# Patient Record
Sex: Female | Born: 1945 | Race: White | Hispanic: No | Marital: Married | Smoking: Former smoker
Health system: Southern US, Community
[De-identification: ages and names within clinical notes are randomized; demographics above are authoritative.]

## PROBLEM LIST (undated history)

## (undated) DIAGNOSIS — N1831 Chronic kidney disease, stage 3a: Secondary | ICD-10-CM

## (undated) DIAGNOSIS — M810 Age-related osteoporosis without current pathological fracture: Secondary | ICD-10-CM

## (undated) DIAGNOSIS — J40 Bronchitis, not specified as acute or chronic: Secondary | ICD-10-CM

## (undated) DIAGNOSIS — E785 Hyperlipidemia, unspecified: Secondary | ICD-10-CM

## (undated) DIAGNOSIS — R42 Dizziness and giddiness: Secondary | ICD-10-CM

## (undated) DIAGNOSIS — J449 Chronic obstructive pulmonary disease, unspecified: Secondary | ICD-10-CM

## (undated) DIAGNOSIS — M1990 Unspecified osteoarthritis, unspecified site: Secondary | ICD-10-CM

## (undated) DIAGNOSIS — I700 Atherosclerosis of aorta: Secondary | ICD-10-CM

## (undated) DIAGNOSIS — Z8659 Personal history of other mental and behavioral disorders: Secondary | ICD-10-CM

## (undated) DIAGNOSIS — E7800 Pure hypercholesterolemia, unspecified: Secondary | ICD-10-CM

## (undated) DIAGNOSIS — J302 Other seasonal allergic rhinitis: Secondary | ICD-10-CM

## (undated) DIAGNOSIS — J301 Allergic rhinitis due to pollen: Secondary | ICD-10-CM

## (undated) DIAGNOSIS — IMO0001 Reserved for inherently not codable concepts without codable children: Secondary | ICD-10-CM

## (undated) DIAGNOSIS — K760 Fatty (change of) liver, not elsewhere classified: Secondary | ICD-10-CM

## (undated) DIAGNOSIS — K219 Gastro-esophageal reflux disease without esophagitis: Secondary | ICD-10-CM

## (undated) DIAGNOSIS — J45909 Unspecified asthma, uncomplicated: Secondary | ICD-10-CM

## (undated) DIAGNOSIS — Z9889 Other specified postprocedural states: Secondary | ICD-10-CM

## (undated) DIAGNOSIS — K759 Inflammatory liver disease, unspecified: Secondary | ICD-10-CM

## (undated) DIAGNOSIS — J4540 Moderate persistent asthma, uncomplicated: Secondary | ICD-10-CM

## (undated) HISTORY — PX: DILATION AND CURETTAGE OF UTERUS: SHX78

## (undated) HISTORY — PX: OOPHORECTOMY: SHX86

## (undated) HISTORY — PX: ABDOMINAL HYSTERECTOMY: SHX81

## (undated) HISTORY — PX: EYE SURGERY: SHX253

---

## 2005-08-22 ENCOUNTER — Ambulatory Visit: Payer: Self-pay | Admitting: Family Medicine

## 2006-08-27 ENCOUNTER — Ambulatory Visit: Payer: Self-pay | Admitting: Family Medicine

## 2007-08-19 ENCOUNTER — Ambulatory Visit: Payer: Self-pay | Admitting: Gastroenterology

## 2007-11-04 ENCOUNTER — Ambulatory Visit: Payer: Self-pay | Admitting: Family Medicine

## 2008-11-08 ENCOUNTER — Ambulatory Visit: Payer: Self-pay | Admitting: Family Medicine

## 2009-11-14 ENCOUNTER — Ambulatory Visit: Payer: Self-pay | Admitting: Family Medicine

## 2010-11-23 ENCOUNTER — Ambulatory Visit: Payer: Self-pay | Admitting: Family Medicine

## 2011-05-02 ENCOUNTER — Ambulatory Visit: Payer: Self-pay | Admitting: Family Medicine

## 2011-12-20 ENCOUNTER — Ambulatory Visit: Payer: Self-pay | Admitting: Family Medicine

## 2012-12-23 ENCOUNTER — Ambulatory Visit: Payer: Self-pay | Admitting: Family Medicine

## 2013-10-07 ENCOUNTER — Ambulatory Visit: Payer: Self-pay | Admitting: Neurosurgery

## 2014-01-01 ENCOUNTER — Ambulatory Visit: Payer: Self-pay | Admitting: Family Medicine

## 2015-03-22 ENCOUNTER — Other Ambulatory Visit: Payer: Self-pay

## 2015-03-22 DIAGNOSIS — Z1231 Encounter for screening mammogram for malignant neoplasm of breast: Secondary | ICD-10-CM

## 2015-04-07 ENCOUNTER — Other Ambulatory Visit: Payer: Self-pay

## 2015-04-07 ENCOUNTER — Ambulatory Visit
Admission: RE | Admit: 2015-04-07 | Discharge: 2015-04-07 | Disposition: A | Discharge status: Home / Self Care | Payer: Medicare Other | Source: Ambulatory Visit | Attending: Family Medicine | Admitting: Family Medicine

## 2015-04-07 DIAGNOSIS — Z1231 Encounter for screening mammogram for malignant neoplasm of breast: Secondary | ICD-10-CM | POA: Diagnosis not present

## 2015-04-11 ENCOUNTER — Other Ambulatory Visit: Payer: Self-pay | Admitting: Family Medicine

## 2015-04-11 DIAGNOSIS — N63 Unspecified lump in unspecified breast: Secondary | ICD-10-CM

## 2015-04-11 DIAGNOSIS — R928 Other abnormal and inconclusive findings on diagnostic imaging of breast: Secondary | ICD-10-CM

## 2015-04-14 ENCOUNTER — Ambulatory Visit
Admission: RE | Admit: 2015-04-14 | Discharge: 2015-04-14 | Disposition: A | Discharge status: Home / Self Care | Payer: Medicare Other | Source: Ambulatory Visit | Attending: Family Medicine | Admitting: Family Medicine

## 2015-04-14 ENCOUNTER — Other Ambulatory Visit: Payer: Medicare Other

## 2015-04-14 DIAGNOSIS — R928 Other abnormal and inconclusive findings on diagnostic imaging of breast: Secondary | ICD-10-CM

## 2015-04-14 DIAGNOSIS — N63 Unspecified lump in unspecified breast: Secondary | ICD-10-CM

## 2015-04-14 DIAGNOSIS — R922 Inconclusive mammogram: Secondary | ICD-10-CM | POA: Insufficient documentation

## 2015-04-15 ENCOUNTER — Other Ambulatory Visit: Payer: Medicare Other

## 2015-04-15 ENCOUNTER — Ambulatory Visit: Payer: Medicare Other

## 2016-01-10 ENCOUNTER — Encounter: Payer: Self-pay | Admitting: *Deleted

## 2016-01-15 NOTE — H&P (Signed)
See scanned note.

## 2016-01-16 ENCOUNTER — Ambulatory Visit: Payer: Medicare HMO | Admitting: Certified Registered Nurse Anesthetist

## 2016-01-16 ENCOUNTER — Encounter
Admission: RE | Disposition: A | Discharge status: Home / Self Care | Payer: Self-pay | Source: Ambulatory Visit | Attending: Ophthalmology

## 2016-01-16 ENCOUNTER — Encounter: Payer: Self-pay | Admitting: *Deleted

## 2016-01-16 ENCOUNTER — Ambulatory Visit
Admission: RE | Admit: 2016-01-16 | Discharge: 2016-01-16 | Disposition: A | Discharge status: Home / Self Care | Payer: Medicare HMO | Source: Ambulatory Visit | Attending: Ophthalmology | Admitting: Ophthalmology

## 2016-01-16 DIAGNOSIS — Z881 Allergy status to other antibiotic agents status: Secondary | ICD-10-CM | POA: Insufficient documentation

## 2016-01-16 DIAGNOSIS — M1990 Unspecified osteoarthritis, unspecified site: Secondary | ICD-10-CM | POA: Diagnosis not present

## 2016-01-16 DIAGNOSIS — F17210 Nicotine dependence, cigarettes, uncomplicated: Secondary | ICD-10-CM | POA: Diagnosis not present

## 2016-01-16 DIAGNOSIS — Z7982 Long term (current) use of aspirin: Secondary | ICD-10-CM | POA: Insufficient documentation

## 2016-01-16 DIAGNOSIS — E7800 Pure hypercholesterolemia, unspecified: Secondary | ICD-10-CM | POA: Diagnosis not present

## 2016-01-16 DIAGNOSIS — J449 Chronic obstructive pulmonary disease, unspecified: Secondary | ICD-10-CM | POA: Diagnosis not present

## 2016-01-16 DIAGNOSIS — H2511 Age-related nuclear cataract, right eye: Secondary | ICD-10-CM | POA: Diagnosis present

## 2016-01-16 DIAGNOSIS — Z79899 Other long term (current) drug therapy: Secondary | ICD-10-CM | POA: Insufficient documentation

## 2016-01-16 HISTORY — DX: Unspecified osteoarthritis, unspecified site: M19.90

## 2016-01-16 HISTORY — PX: CATARACT EXTRACTION W/PHACO: SHX586

## 2016-01-16 HISTORY — DX: Reserved for inherently not codable concepts without codable children: IMO0001

## 2016-01-16 HISTORY — DX: Dizziness and giddiness: R42

## 2016-01-16 HISTORY — DX: Inflammatory liver disease, unspecified: K75.9

## 2016-01-16 SURGERY — PHACOEMULSIFICATION, CATARACT, WITH IOL INSERTION
Anesthesia: Monitor Anesthesia Care | Site: Eye | Laterality: Right | Wound class: Clean

## 2016-01-16 MED ORDER — NA CHONDROIT SULF-NA HYALURON 40-17 MG/ML IO SOLN
INTRAOCULAR | Status: DC | PRN
  Administered 2016-01-16: 1 mL via INTRAOCULAR

## 2016-01-16 MED ORDER — CYCLOPENTOLATE HCL 2 % OP SOLN
OPHTHALMIC | Status: AC
  Administered 2016-01-16: 1 [drp] via OPHTHALMIC
  Filled 2016-01-16: qty 2

## 2016-01-16 MED ORDER — SODIUM CHLORIDE 0.9 % IV SOLN
INTRAVENOUS | Status: DC
  Administered 2016-01-16: 07:00:00 via INTRAVENOUS

## 2016-01-16 MED ORDER — BUPIVACAINE HCL (PF) 0.75 % IJ SOLN
INTRAMUSCULAR | Status: AC
  Filled 2016-01-16: qty 10

## 2016-01-16 MED ORDER — FENTANYL CITRATE (PF) 100 MCG/2ML IJ SOLN: 25.0000 ug | INTRAMUSCULAR | Status: DC | PRN

## 2016-01-16 MED ORDER — BSS IO SOLN
INTRAOCULAR | Status: DC | PRN
  Administered 2016-01-16: 1 mL via OPHTHALMIC

## 2016-01-16 MED ORDER — MOXIFLOXACIN HCL 0.5 % OP SOLN
OPHTHALMIC | Status: DC | PRN
  Administered 2016-01-16: 1 [drp] via OPHTHALMIC

## 2016-01-16 MED ORDER — CEFUROXIME OPHTHALMIC INJECTION 1 MG/0.1 ML
INJECTION | OPHTHALMIC | Status: DC | PRN
  Administered 2016-01-16: .1 mL via INTRACAMERAL

## 2016-01-16 MED ORDER — CYCLOPENTOLATE HCL 2 % OP SOLN
1.0000 [drp] | OPHTHALMIC | Status: AC | PRN
  Administered 2016-01-16 (×4): 1 [drp] via OPHTHALMIC

## 2016-01-16 MED ORDER — PHENYLEPHRINE HCL 10 % OP SOLN
OPHTHALMIC | Status: AC
  Administered 2016-01-16: 1 [drp] via OPHTHALMIC
  Filled 2016-01-16: qty 5

## 2016-01-16 MED ORDER — CARBACHOL 0.01 % IO SOLN
INTRAOCULAR | Status: DC | PRN
  Administered 2016-01-16: .5 mL via INTRAOCULAR

## 2016-01-16 MED ORDER — EPINEPHRINE HCL 1 MG/ML IJ SOLN
INTRAMUSCULAR | Status: AC
  Filled 2016-01-16: qty 2

## 2016-01-16 MED ORDER — PHENYLEPHRINE HCL 10 % OP SOLN
1.0000 [drp] | OPHTHALMIC | Status: AC | PRN
  Administered 2016-01-16 (×4): 1 [drp] via OPHTHALMIC

## 2016-01-16 MED ORDER — NA CHONDROIT SULF-NA HYALURON 40-17 MG/ML IO SOLN
INTRAOCULAR | Status: AC
  Filled 2016-01-16: qty 1

## 2016-01-16 MED ORDER — MOXIFLOXACIN HCL 0.5 % OP SOLN
OPHTHALMIC | Status: AC
  Administered 2016-01-16: 1 [drp] via OPHTHALMIC
  Filled 2016-01-16: qty 3

## 2016-01-16 MED ORDER — TETRACAINE HCL 0.5 % OP SOLN
OPHTHALMIC | Status: DC | PRN
  Administered 2016-01-16: 1 [drp] via OPHTHALMIC

## 2016-01-16 MED ORDER — HYALURONIDASE HUMAN 150 UNIT/ML IJ SOLN
INTRAMUSCULAR | Status: AC
  Filled 2016-01-16: qty 1

## 2016-01-16 MED ORDER — MIDAZOLAM HCL 2 MG/2ML IJ SOLN
INTRAMUSCULAR | Status: DC | PRN
  Administered 2016-01-16 (×2): 1 mg via INTRAVENOUS

## 2016-01-16 MED ORDER — LIDOCAINE HCL (PF) 4 % IJ SOLN
INTRAMUSCULAR | Status: DC | PRN
  Administered 2016-01-16: 4 mL via OPHTHALMIC

## 2016-01-16 MED ORDER — MOXIFLOXACIN HCL 0.5 % OP SOLN
1.0000 [drp] | OPHTHALMIC | Status: AC | PRN
  Administered 2016-01-16 (×3): 1 [drp] via OPHTHALMIC

## 2016-01-16 MED ORDER — TETRACAINE HCL 0.5 % OP SOLN
OPHTHALMIC | Status: AC
  Filled 2016-01-16: qty 2

## 2016-01-16 MED ORDER — CEFUROXIME OPHTHALMIC INJECTION 1 MG/0.1 ML
INJECTION | OPHTHALMIC | Status: AC
  Filled 2016-01-16: qty 0.1

## 2016-01-16 MED ORDER — LIDOCAINE HCL (PF) 4 % IJ SOLN
INTRAOCULAR | Status: DC | PRN
  Administered 2016-01-16: .5 mL via OPHTHALMIC

## 2016-01-16 MED ORDER — ALFENTANIL 500 MCG/ML IJ INJ
INJECTION | INTRAMUSCULAR | Status: DC | PRN
  Administered 2016-01-16: 500 ug via INTRAVENOUS

## 2016-01-16 MED ORDER — ONDANSETRON HCL 4 MG/2ML IJ SOLN: 4.0000 mg | Freq: Once | INTRAMUSCULAR | Status: DC | PRN

## 2016-01-16 MED ORDER — LIDOCAINE HCL (PF) 4 % IJ SOLN
INTRAMUSCULAR | Status: AC
  Filled 2016-01-16: qty 5

## 2016-01-16 SURGICAL SUPPLY — 29 items
CANNULA ANT/CHMB 27GA (MISCELLANEOUS) ×2 IMPLANT
CORD BIP STRL DISP 12FT (MISCELLANEOUS) ×2 IMPLANT
CUP MEDICINE 2OZ PLAST GRAD ST (MISCELLANEOUS) ×2 IMPLANT
DRAPE XRAY CASSETTE 23X24 (DRAPES) ×2 IMPLANT
ERASER HMR WETFIELD 18G (MISCELLANEOUS) ×2 IMPLANT
GLOVE BIO SURGEON STRL SZ8 (GLOVE) ×2 IMPLANT
GLOVE SURG LX 6.5 MICRO (GLOVE) ×1
GLOVE SURG LX 8.0 MICRO (GLOVE) ×1
GLOVE SURG LX STRL 6.5 MICRO (GLOVE) ×1 IMPLANT
GLOVE SURG LX STRL 8.0 MICRO (GLOVE) ×1 IMPLANT
GOWN STRL REUS W/ TWL LRG LVL3 (GOWN DISPOSABLE) ×1 IMPLANT
GOWN STRL REUS W/ TWL XL LVL3 (GOWN DISPOSABLE) ×1 IMPLANT
GOWN STRL REUS W/TWL LRG LVL3 (GOWN DISPOSABLE) ×1
GOWN STRL REUS W/TWL XL LVL3 (GOWN DISPOSABLE) ×1
LENS IOL ACRYSOF IQ 22.0 (Intraocular Lens) ×2 IMPLANT
PACK CATARACT (MISCELLANEOUS) ×2 IMPLANT
PACK CATARACT DINGLEDEIN LX (MISCELLANEOUS) ×2 IMPLANT
PACK EYE AFTER SURG (MISCELLANEOUS) ×2 IMPLANT
SHLD EYE VISITEC  UNIV (MISCELLANEOUS) ×2 IMPLANT
SOL BSS BAG (MISCELLANEOUS) ×2
SOL PREP PVP 2OZ (MISCELLANEOUS) ×2
SOLUTION BSS BAG (MISCELLANEOUS) ×1 IMPLANT
SOLUTION PREP PVP 2OZ (MISCELLANEOUS) ×1 IMPLANT
SUT SILK 5-0 (SUTURE) ×2 IMPLANT
SYR 3ML LL SCALE MARK (SYRINGE) ×2 IMPLANT
SYR 5ML LL (SYRINGE) ×2 IMPLANT
SYR TB 1ML 27GX1/2 LL (SYRINGE) ×2 IMPLANT
WATER STERILE IRR 1000ML POUR (IV SOLUTION) ×2 IMPLANT
WIPE NON LINTING 3.25X3.25 (MISCELLANEOUS) ×2 IMPLANT

## 2016-01-16 NOTE — Transfer of Care (Signed)
Immediate Anesthesia Transfer of Care Note  Patient: Caydance Writer Wolin  Procedure(s) Performed: Procedure(s) with comments: CATARACT EXTRACTION PHACO AND INTRAOCULAR LENS PLACEMENT (IOC) (Right) - [place] 01:14 AP% 25.7 CDE 35.31 fluid pack lot # [id number]  Patient Location: PACU  Anesthesia Type:MAC  Level of Consciousness: awake, alert , oriented and patient cooperative  Airway & Oxygen Therapy: Patient Spontanous Breathing  Post-op Assessment: Report given to RN and Post -op Vital signs reviewed and stable  Post vital signs: Reviewed and stable  Last Vitals:  Filed Vitals:   01/16/16 0700 01/16/16 0906  BP: 149/59 121/53  Pulse: 52 54  Temp: 36.6 C 36.6 C  Resp: 20 20    Complications: No apparent anesthesia complications

## 2016-01-16 NOTE — Interval H&P Note (Signed)
History and Physical Interval Note:  01/16/2016 7:27 AM  Whitney Glover  has presented today for surgery, with the diagnosis of CATARACT  The various methods of treatment have been discussed with the patient and family. After consideration of risks, benefits and other options for treatment, the patient has consented to  Procedure(s): CATARACT EXTRACTION PHACO AND INTRAOCULAR LENS PLACEMENT ([place]) (Right) as a surgical intervention .  The patient's history has been reviewed, patient examined, no change in status, stable for surgery.  I have reviewed the patient's chart and labs.  Questions were answered to the patient's satisfaction.     Faaris Arizpe

## 2016-01-16 NOTE — Anesthesia Preprocedure Evaluation (Signed)
Anesthesia Evaluation  Patient identified by MRN, date of birth, ID band Patient awake    Reviewed: Allergy & Precautions, NPO status , Patient's Chart, lab work & pertinent test results  Airway Mallampati: II  TM Distance: <3 FB     Dental  (+) Upper Dentures, Partial Lower   Pulmonary shortness of breath and with exertion, COPD,  COPD inhaler, Current Smoker,    Pulmonary exam normal breath sounds clear to auscultation       Cardiovascular negative cardio ROS Normal cardiovascular exam     Neuro/Psych negative neurological ROS  negative psych ROS   GI/Hepatic negative GI ROS, (+) Hepatitis -, Unspecified  Endo/Other  negative endocrine ROS  Renal/GU negative Renal ROS  negative genitourinary   Musculoskeletal  (+) Arthritis , Osteoarthritis,    Abdominal Normal abdominal exam  (+)   Peds negative pediatric ROS (+)  Hematology negative hematology ROS (+)   Anesthesia Other Findings   Reproductive/Obstetrics                             Anesthesia Physical Anesthesia Plan  ASA: III  Anesthesia Plan: MAC and General   Post-op Pain Management:    Induction: Intravenous  Airway Management Planned: Nasal Cannula  Additional Equipment:   Intra-op Plan:   Post-operative Plan:   Informed Consent: I have reviewed the patients History and Physical, chart, labs and discussed the procedure including the risks, benefits and alternatives for the proposed anesthesia with the patient or authorized representative who has indicated his/her understanding and acceptance.   Dental advisory given  Plan Discussed with: CRNA and Surgeon  Anesthesia Plan Comments:         Anesthesia Quick Evaluation

## 2016-01-16 NOTE — Discharge Instructions (Signed)
AMBULATORY SURGERY  DISCHARGE INSTRUCTIONS   1) The drugs that you were given will stay in your system until tomorrow so for the next 24 hours you should not:  A) Drive an automobile B) Make any legal decisions C) Drink any alcoholic beverage   2) You may resume regular meals tomorrow.  Today it is better to start with liquids and gradually work up to solid foods.  You may eat anything you prefer, but it is better to start with liquids, then soup and crackers, and gradually work up to solid foods.   3) Please notify your doctor immediately if you have any unusual bleeding, trouble breathing, redness and pain at the surgery site, drainage, fever, or pain not relieved by medication.    4) Additional Instructions:   Eye Surgery Discharge Instructions  Expect mild scratchy sensation or mild soreness. DO NOT RUB YOUR EYE!  The day of surgery:  Minimal physical activity, but bed rest is not required  No reading, computer work, or close hand work  No bending, lifting, or straining.  May watch TV  For 24 hours:  No driving, legal decisions, or alcoholic beverages  Safety precautions  Eat anything you prefer: It is better to start with liquids, then soup then solid foods.  _____ Eye patch should be worn until postoperative exam tomorrow.  ____ Solar shield eyeglasses should be worn for comfort in the sunlight/patch while sleeping  Resume all regular medications including aspirin or Coumadin if these were discontinued prior to surgery. You may shower, bathe, shave, or wash your hair. Tylenol may be taken for mild discomfort.  Call your doctor if you experience significant pain, nausea, or vomiting, fever > 101 or other signs of infection. [phone] or [phone] Specific instructions:  Follow-up Information    Follow up with Mashonda Broski, MD In 1 day.   Specialty:  Ophthalmology   Why:  February 28 at 8:10am   Contact information:   [address]   [place] [address] [phone]          Please contact your physician with any problems or Same Day Surgery at [phone], Monday through Friday 6 am to 4 pm, or [place] at [facility] number at [phone].

## 2016-01-16 NOTE — Op Note (Signed)
Date of Surgery: 01/16/2016 Date of Dictation: 01/16/2016 9:00 AM Pre-operative Diagnosis:  Nuclear Sclerotic Cataract right Eye Post-operative Diagnosis: same Procedure performed: Extra-capsular Cataract Extraction (ECCE) with placement of a posterior chamber intraocular lens (IOL) right Eye IOL:  Implant Name Type Inv. Item Serial No. Manufacturer Lot No. LRB No. Used  LENS IOL ACRYSOF IQ 22.0 - [id number] Intraocular Lens LENS IOL ACRYSOF IQ 22.0 [id number] ALCON [id number] Right 1   Anesthesia: 2% Lidocaine and 4% Marcaine in a 50/50 mixture with 10 unites/ml of Hylenex given as a peribulbar Anesthesiologist: Anesthesiologist: Martha Clan, MD CRNA: Bernardo Heater, CRNA Complications: none Estimated Blood Loss: less than 1 ml  Description of procedure:  The patient was given anesthesia and sedation via intravenous access. The patient was then prepped and draped in the usual fashion. A 25-gauge needle was bent for initiating the capsulorhexis. A 5-0 silk suture was placed through the conjunctiva superior and inferiorly to serve as bridle sutures. Hemostasis was obtained at the superior limbus using an eraser cautery. A partial thickness groove was made at the anterior surgical limbus with a 64 Beaver blade and this was dissected anteriorly with an Avaya. The anterior chamber was entered at 10 o'clock with a 1.0 mm paracentesis knife and through the lamellar dissection with a 2.6 mm Alcon keratome. Epi-Shugarcaine 0.5 CC [9 cc BSS Plus (Alcon), 3 cc 4% preservative-free lidocaine (Hospira) and 4 cc 1:1000 preservative-free, bisulfite-free epinephrine] was injected into the anterior chamber via the paracentesis tract. Epi-Shugarcaine 0.5 CC [9 cc BSS Plus (Alcon), 3 cc 4% preservative-free lidocaine (Hospira) and 4 cc 1:1000 preservative-free, bisulfite-free epinephrine] was injected into the anterior chamber via the paracentesis tract. DiscoVisc was injected to replace the aqueous  and a continuous tear curvilinear capsulorhexis was performed using a bent 25-gauge needle.  Balance salt on a syringe was used to perform hydro-dissection and phacoemulsification was carried out using a divide and conquer technique. Procedure(s) with comments: CATARACT EXTRACTION PHACO AND INTRAOCULAR LENS PLACEMENT ([facility]) (Right) - [place] 01:14 AP% 25.7 CDE 35.31 fluid pack lot # [id number]. Irrigation/aspiration was used to remove the residual cortex and the capsular bag was inflated with DiscoVisc. The intraocular lens was inserted into the capsular bag using a pre-loaded UltraSert Delivery System. Irrigation/aspiration was used to remove the residual DiscoVisc. The wound was inflated with balanced salt and checked for leaks. None were found. Miostat was injected via the paracentesis track and 0.1 ml of cefuroxime containing 1 mg of drug  was injected via the paracentesis track. The wound was checked for leaks again and none were found.   The bridal sutures were removed and two drops of Vigamox were placed on the eye. An eye shield was placed to protect the eye and the patient was discharged to the recovery area in good condition.   Brevin Mcfadden MD

## 2016-01-17 NOTE — Anesthesia Postprocedure Evaluation (Signed)
Anesthesia Post Note  Patient: Whitney Glover  Procedure(s) Performed: Procedure(s) (LRB): CATARACT EXTRACTION PHACO AND INTRAOCULAR LENS PLACEMENT (IOC) (Right)  Patient location during evaluation: PACU Anesthesia Type: General Level of consciousness: awake and alert Pain management: pain level controlled Vital Signs Assessment: post-procedure vital signs reviewed and stable Respiratory status: spontaneous breathing, nonlabored ventilation, respiratory function stable and patient connected to nasal cannula oxygen Cardiovascular status: blood pressure returned to baseline and stable Postop Assessment: no signs of nausea or vomiting Anesthetic complications: no    Last Vitals:  Filed Vitals:   01/16/16 0700 01/16/16 0906  BP: 149/59 121/53  Pulse: 52 54  Temp: 36.6 C 36.6 C  Resp: 20 20    Last Pain:  Filed Vitals:   01/17/16 0806  PainSc: 0-No pain                 Martha Clan

## 2016-02-03 ENCOUNTER — Encounter: Payer: Self-pay | Admitting: *Deleted

## 2016-02-05 NOTE — H&P (Signed)
See scanned note.

## 2016-02-06 ENCOUNTER — Ambulatory Visit: Payer: Medicare HMO | Admitting: Anesthesiology

## 2016-02-06 ENCOUNTER — Encounter: Payer: Self-pay | Admitting: *Deleted

## 2016-02-06 ENCOUNTER — Encounter
Admission: RE | Disposition: A | Discharge status: Home / Self Care | Payer: Self-pay | Source: Ambulatory Visit | Attending: Ophthalmology

## 2016-02-06 ENCOUNTER — Ambulatory Visit
Admission: RE | Admit: 2016-02-06 | Discharge: 2016-02-06 | Disposition: A | Discharge status: Home / Self Care | Payer: Medicare HMO | Source: Ambulatory Visit | Attending: Ophthalmology | Admitting: Ophthalmology

## 2016-02-06 DIAGNOSIS — F17210 Nicotine dependence, cigarettes, uncomplicated: Secondary | ICD-10-CM | POA: Diagnosis not present

## 2016-02-06 DIAGNOSIS — H2512 Age-related nuclear cataract, left eye: Secondary | ICD-10-CM | POA: Diagnosis present

## 2016-02-06 DIAGNOSIS — J449 Chronic obstructive pulmonary disease, unspecified: Secondary | ICD-10-CM | POA: Diagnosis not present

## 2016-02-06 DIAGNOSIS — Z7982 Long term (current) use of aspirin: Secondary | ICD-10-CM | POA: Diagnosis not present

## 2016-02-06 DIAGNOSIS — Z79899 Other long term (current) drug therapy: Secondary | ICD-10-CM | POA: Diagnosis not present

## 2016-02-06 DIAGNOSIS — M1990 Unspecified osteoarthritis, unspecified site: Secondary | ICD-10-CM | POA: Diagnosis not present

## 2016-02-06 DIAGNOSIS — E7800 Pure hypercholesterolemia, unspecified: Secondary | ICD-10-CM | POA: Insufficient documentation

## 2016-02-06 HISTORY — PX: CATARACT EXTRACTION W/PHACO: SHX586

## 2016-02-06 HISTORY — DX: Bronchitis, not specified as acute or chronic: J40

## 2016-02-06 HISTORY — DX: Pure hypercholesterolemia, unspecified: E78.00

## 2016-02-06 HISTORY — DX: Other seasonal allergic rhinitis: J30.2

## 2016-02-06 SURGERY — PHACOEMULSIFICATION, CATARACT, WITH IOL INSERTION
Anesthesia: Monitor Anesthesia Care | Site: Eye | Laterality: Left | Wound class: Clean

## 2016-02-06 MED ORDER — CEFUROXIME OPHTHALMIC INJECTION 1 MG/0.1 ML
INJECTION | OPHTHALMIC | Status: AC
  Filled 2016-02-06: qty 0.1

## 2016-02-06 MED ORDER — PHENYLEPHRINE HCL 10 % OP SOLN
1.0000 [drp] | Freq: Once | OPHTHALMIC | Status: AC
  Administered 2016-02-06: 1 [drp] via OPHTHALMIC

## 2016-02-06 MED ORDER — LIDOCAINE HCL (PF) 4 % IJ SOLN
INTRAOCULAR | Status: DC | PRN
  Administered 2016-02-06: .5 mL via OPHTHALMIC

## 2016-02-06 MED ORDER — PHENYLEPHRINE HCL 10 % OP SOLN
OPHTHALMIC | Status: AC
  Administered 2016-02-06: 1 [drp] via OPHTHALMIC
  Filled 2016-02-06: qty 5

## 2016-02-06 MED ORDER — LIDOCAINE HCL (PF) 4 % IJ SOLN
INTRAMUSCULAR | Status: DC | PRN
  Administered 2016-02-06: 4 mL via OPHTHALMIC

## 2016-02-06 MED ORDER — NA CHONDROIT SULF-NA HYALURON 40-17 MG/ML IO SOLN
INTRAOCULAR | Status: DC | PRN
  Administered 2016-02-06: 1 mL via INTRAOCULAR

## 2016-02-06 MED ORDER — BUPIVACAINE HCL (PF) 0.75 % IJ SOLN
INTRAMUSCULAR | Status: AC
  Filled 2016-02-06: qty 10

## 2016-02-06 MED ORDER — CEFUROXIME OPHTHALMIC INJECTION 1 MG/0.1 ML
INJECTION | OPHTHALMIC | Status: DC | PRN
  Administered 2016-02-06: .1 mL via INTRACAMERAL

## 2016-02-06 MED ORDER — CYCLOPENTOLATE HCL 2 % OP SOLN
1.0000 [drp] | OPHTHALMIC | Status: AC
  Administered 2016-02-06 (×3): 1 [drp] via OPHTHALMIC

## 2016-02-06 MED ORDER — CYCLOPENTOLATE HCL 2 % OP SOLN
1.0000 [drp] | Freq: Once | OPHTHALMIC | Status: AC
  Administered 2016-02-06: 1 [drp] via OPHTHALMIC

## 2016-02-06 MED ORDER — PHENYLEPHRINE HCL 10 % OP SOLN
1.0000 [drp] | OPHTHALMIC | Status: AC
  Administered 2016-02-06 (×3): 1 [drp] via OPHTHALMIC

## 2016-02-06 MED ORDER — MOXIFLOXACIN HCL 0.5 % OP SOLN
1.0000 [drp] | OPHTHALMIC | Status: AC
  Administered 2016-02-06 (×2): 1 [drp] via OPHTHALMIC

## 2016-02-06 MED ORDER — TETRACAINE HCL 0.5 % OP SOLN
OPHTHALMIC | Status: AC
  Filled 2016-02-06: qty 2

## 2016-02-06 MED ORDER — MOXIFLOXACIN HCL 0.5 % OP SOLN
OPHTHALMIC | Status: AC
  Administered 2016-02-06: 1 [drp] via OPHTHALMIC
  Filled 2016-02-06: qty 3

## 2016-02-06 MED ORDER — SODIUM CHLORIDE 0.9 % IV SOLN
INTRAVENOUS | Status: DC
  Administered 2016-02-06: 08:00:00 via INTRAVENOUS

## 2016-02-06 MED ORDER — EPINEPHRINE HCL 1 MG/ML IJ SOLN
INTRAOCULAR | Status: DC | PRN
  Administered 2016-02-06: 1 mL via OPHTHALMIC

## 2016-02-06 MED ORDER — EPINEPHRINE HCL 1 MG/ML IJ SOLN
INTRAMUSCULAR | Status: AC
  Filled 2016-02-06: qty 2

## 2016-02-06 MED ORDER — CYCLOPENTOLATE HCL 2 % OP SOLN
OPHTHALMIC | Status: AC
  Administered 2016-02-06: 1 [drp] via OPHTHALMIC
  Filled 2016-02-06: qty 2

## 2016-02-06 MED ORDER — TETRACAINE HCL 0.5 % OP SOLN
OPHTHALMIC | Status: DC | PRN
  Administered 2016-02-06: 1 [drp] via OPHTHALMIC

## 2016-02-06 MED ORDER — MOXIFLOXACIN HCL 0.5 % OP SOLN
1.0000 [drp] | Freq: Once | OPHTHALMIC | Status: AC
  Administered 2016-02-06: 1 [drp] via OPHTHALMIC

## 2016-02-06 MED ORDER — MOXIFLOXACIN HCL 0.5 % OP SOLN
OPHTHALMIC | Status: DC | PRN
  Administered 2016-02-06: 1 [drp] via OPHTHALMIC

## 2016-02-06 MED ORDER — POVIDONE-IODINE 5 % OP SOLN
OPHTHALMIC | Status: DC | PRN
  Administered 2016-02-06: 1 via OPHTHALMIC

## 2016-02-06 MED ORDER — POVIDONE-IODINE 5 % OP SOLN
OPHTHALMIC | Status: AC
  Filled 2016-02-06: qty 30

## 2016-02-06 MED ORDER — CARBACHOL 0.01 % IO SOLN
INTRAOCULAR | Status: DC | PRN
  Administered 2016-02-06: .5 mL via INTRAOCULAR

## 2016-02-06 MED ORDER — NA CHONDROIT SULF-NA HYALURON 40-17 MG/ML IO SOLN
INTRAOCULAR | Status: AC
  Filled 2016-02-06: qty 1

## 2016-02-06 MED ORDER — LIDOCAINE HCL (PF) 4 % IJ SOLN
INTRAMUSCULAR | Status: AC
  Filled 2016-02-06: qty 5

## 2016-02-06 MED ORDER — HYALURONIDASE HUMAN 150 UNIT/ML IJ SOLN
INTRAMUSCULAR | Status: AC
  Filled 2016-02-06: qty 1

## 2016-02-06 MED ORDER — ALFENTANIL 500 MCG/ML IJ INJ
INJECTION | INTRAMUSCULAR | Status: DC | PRN
  Administered 2016-02-06: 500 ug via INTRAVENOUS

## 2016-02-06 MED ORDER — ONDANSETRON HCL 4 MG/2ML IJ SOLN
INTRAMUSCULAR | Status: DC | PRN
  Administered 2016-02-06: 4 mg via INTRAVENOUS

## 2016-02-06 MED ORDER — MIDAZOLAM HCL 2 MG/2ML IJ SOLN
INTRAMUSCULAR | Status: DC | PRN
  Administered 2016-02-06 (×2): 0.5 mg via INTRAVENOUS

## 2016-02-06 SURGICAL SUPPLY — 29 items
CANNULA ANT/CHMB 27GA (MISCELLANEOUS) ×2 IMPLANT
CORD BIP STRL DISP 12FT (MISCELLANEOUS) ×2 IMPLANT
CUP MEDICINE 2OZ PLAST GRAD ST (MISCELLANEOUS) ×2 IMPLANT
DRAPE XRAY CASSETTE 23X24 (DRAPES) ×2 IMPLANT
ERASER HMR WETFIELD 18G (MISCELLANEOUS) ×2 IMPLANT
GLOVE BIO SURGEON STRL SZ8 (GLOVE) ×2 IMPLANT
GLOVE SURG LX 6.5 MICRO (GLOVE) ×1
GLOVE SURG LX 8.0 MICRO (GLOVE) ×1
GLOVE SURG LX STRL 6.5 MICRO (GLOVE) ×1 IMPLANT
GLOVE SURG LX STRL 8.0 MICRO (GLOVE) ×1 IMPLANT
GOWN STRL REUS W/ TWL LRG LVL3 (GOWN DISPOSABLE) ×1 IMPLANT
GOWN STRL REUS W/ TWL XL LVL3 (GOWN DISPOSABLE) ×1 IMPLANT
GOWN STRL REUS W/TWL LRG LVL3 (GOWN DISPOSABLE) ×1
GOWN STRL REUS W/TWL XL LVL3 (GOWN DISPOSABLE) ×1
LENS IOL ACRYSOF IQ 21.5 (Intraocular Lens) ×2 IMPLANT
PACK CATARACT (MISCELLANEOUS) ×2 IMPLANT
PACK CATARACT DINGLEDEIN LX (MISCELLANEOUS) ×2 IMPLANT
PACK EYE AFTER SURG (MISCELLANEOUS) ×2 IMPLANT
SHLD EYE VISITEC  UNIV (MISCELLANEOUS) ×2 IMPLANT
SOL BSS BAG (MISCELLANEOUS) ×2
SOL PREP PVP 2OZ (MISCELLANEOUS) ×2
SOLUTION BSS BAG (MISCELLANEOUS) ×1 IMPLANT
SOLUTION PREP PVP 2OZ (MISCELLANEOUS) ×1 IMPLANT
SUT SILK 5-0 (SUTURE) ×2 IMPLANT
SYR 3ML LL SCALE MARK (SYRINGE) ×2 IMPLANT
SYR 5ML LL (SYRINGE) ×2 IMPLANT
SYR TB 1ML 27GX1/2 LL (SYRINGE) ×2 IMPLANT
WATER STERILE IRR 1000ML POUR (IV SOLUTION) ×2 IMPLANT
WIPE NON LINTING 3.25X3.25 (MISCELLANEOUS) ×2 IMPLANT

## 2016-02-06 NOTE — Discharge Instructions (Signed)
Eye Surgery Discharge Instructions  Expect mild scratchy sensation or mild soreness. DO NOT RUB YOUR EYE!  The day of surgery:  Minimal physical activity, but bed rest is not required  No reading, computer work, or close hand work  No bending, lifting, or straining.  May watch TV  For 24 hours:  No driving, legal decisions, or alcoholic beverages  Safety precautions  Eat anything you prefer: It is better to start with liquids, then soup then solid foods.  _____ Eye patch should be worn until postoperative exam tomorrow.  ____ Solar shield eyeglasses should be worn for comfort in the sunlight/patch while sleeping  Resume all regular medications including aspirin or Coumadin if these were discontinued prior to surgery. You may shower, bathe, shave, or wash your hair. Tylenol may be taken for mild discomfort.  Call your doctor if you experience significant pain, nausea, or vomiting, fever > 101 or other signs of infection. [phone] or [phone] Specific instructions:  Follow-up Information    Follow up with Estill Cotta, MD.   Specialty:  Ophthalmology   Why:  follow up 3/21 at [place] information:   [place] [address] [phone]     AMBULATORY SURGERY  DISCHARGE INSTRUCTIONS   1) The drugs that you were given will stay in your system until tomorrow so for the next 24 hours you should not:  A) Drive an automobile B) Make any legal decisions C) Drink any alcoholic beverage   2) You may resume regular meals tomorrow.  Today it is better to start with liquids and gradually work up to solid foods.  You may eat anything you prefer, but it is better to start with liquids, then soup and crackers, and gradually work up to solid foods.   3) Please notify your doctor immediately if you have any unusual bleeding, trouble breathing, redness and pain at the surgery site, drainage, fever, or pain not relieved by  medication.    4) Additional Instructions:        Please contact your physician with any problems or Same Day Surgery at [phone], Monday through Friday 6 am to 4 pm, or [place] at [facility] number at [phone].

## 2016-02-06 NOTE — Op Note (Signed)
Date of Surgery: 02/06/2016 Date of Dictation: 02/06/2016 9:50 AM Pre-operative Diagnosis:  Nuclear Sclerotic Cataract left Eye Post-operative Diagnosis: same Procedure performed: Extra-capsular Cataract Extraction (ECCE) with placement of a posterior chamber intraocular lens (IOL) left Eye IOL:  Implant Name Type Inv. Item Serial No. Manufacturer Lot No. LRB No. Used  LENS IOL ACRYSOF IQ 21.5 - [id number] Intraocular Lens LENS IOL ACRYSOF IQ 21.5 [id number] ALCON [id number] Left 1   Anesthesia: 2% Lidocaine and 4% Marcaine in a 50/50 mixture with 10 unites/ml of Hylenex given as a peribulbar Anesthesiologist: Anesthesiologist: Lyn Hollingshead, MD CRNA: Courtney Paris, CRNA Complications: none Estimated Blood Loss: less than 1 ml  Description of procedure:  The patient was given anesthesia and sedation via intravenous access. The patient was then prepped and draped in the usual fashion. A 25-gauge needle was bent for initiating the capsulorhexis. A 5-0 silk suture was placed through the conjunctiva superior and inferiorly to serve as bridle sutures. Hemostasis was obtained at the superior limbus using an eraser cautery. A partial thickness groove was made at the anterior surgical limbus with a 64 Beaver blade and this was dissected anteriorly with an Avaya. The anterior chamber was entered at 10 o'clock with a 1.0 mm paracentesis knife and through the lamellar dissection with a 2.6 mm Alcon keratome. Epi-Shugarcaine 0.5 CC [9 cc BSS Plus (Alcon), 3 cc 4% preservative-free lidocaine (Hospira) and 4 cc 1:1000 preservative-free, bisulfite-free epinephrine] was injected into the anterior chamber via the paracentesis tract. Epi-Shugarcaine 0.5 CC [9 cc BSS Plus (Alcon), 3 cc 4% preservative-free lidocaine (Hospira) and 4 cc 1:1000 preservative-free, bisulfite-free epinephrine] was injected into the anterior chamber via the paracentesis tract. DiscoVisc was injected to replace the  aqueous and a continuous tear curvilinear capsulorhexis was performed using a bent 25-gauge needle.  Balance salt on a syringe was used to perform hydro-dissection and phacoemulsification was carried out using a divide and conquer technique. Procedure(s) with comments: CATARACT EXTRACTION PHACO AND INTRAOCULAR LENS PLACEMENT (IOC) (Left) - [place] 01:13 AP% 27.3 CDE 33.25 fluid opack lot # [id number]. Irrigation/aspiration was used to remove the residual cortex and the capsular bag was inflated with DiscoVisc. The intraocular lens was inserted into the capsular bag using a pre-loaded UltraSert Delivery System. Irrigation/aspiration was used to remove the residual DiscoVisc. The wound was inflated with balanced salt and checked for leaks. None were found. Miostat was injected via the paracentesis track and 0.1 ml of cefuroxime containing 1 mg of drug  was injected via the paracentesis track. The wound was checked for leaks again and none were found.   The bridal sutures were removed and two drops of Vigamox were placed on the eye. An eye shield was placed to protect the eye and the patient was discharged to the recovery area in good condition.   Quintessa Simmerman MD

## 2016-02-06 NOTE — Interval H&P Note (Signed)
History and Physical Interval Note:  02/06/2016 9:09 AM  Whitney Glover  has presented today for surgery, with the diagnosis of CATARACT  The various methods of treatment have been discussed with the patient and family. After consideration of risks, benefits and other options for treatment, the patient has consented to  Procedure(s): CATARACT EXTRACTION PHACO AND INTRAOCULAR LENS PLACEMENT ([place]) (Left) as a surgical intervention .  The patient's history has been reviewed, patient examined, no change in status, stable for surgery.  I have reviewed the patient's chart and labs.  Questions were answered to the patient's satisfaction.     [name]

## 2016-02-06 NOTE — Anesthesia Preprocedure Evaluation (Signed)
Anesthesia Evaluation  Patient identified by MRN, date of birth, ID band Patient awake    Reviewed: Allergy & Precautions, NPO status , Patient's Chart, lab work & pertinent test results  Airway Mallampati: II  TM Distance: <3 FB     Dental  (+) Upper Dentures, Partial Lower   Pulmonary shortness of breath and with exertion, COPD,  COPD inhaler, Current Smoker,    Pulmonary exam normal        Cardiovascular negative cardio ROS Normal cardiovascular exam     Neuro/Psych negative neurological ROS  negative psych ROS   GI/Hepatic negative GI ROS, (+) Hepatitis -, Unspecified  Endo/Other  negative endocrine ROS  Renal/GU negative Renal ROS  negative genitourinary   Musculoskeletal  (+) Arthritis , Osteoarthritis,    Abdominal Normal abdominal exam  (+)   Peds negative pediatric ROS (+)  Hematology negative hematology ROS (+)   Anesthesia Other Findings   Reproductive/Obstetrics                             Anesthesia Physical  Anesthesia Plan  ASA: III  Anesthesia Plan: MAC   Post-op Pain Management:    Induction: Intravenous  Airway Management Planned: Nasal Cannula  Additional Equipment:   Intra-op Plan:   Post-operative Plan:   Informed Consent: I have reviewed the patients History and Physical, chart, labs and discussed the procedure including the risks, benefits and alternatives for the proposed anesthesia with the patient or authorized representative who has indicated his/her understanding and acceptance.     Plan Discussed with: CRNA and Surgeon  Anesthesia Plan Comments:         Anesthesia Quick Evaluation

## 2016-02-06 NOTE — Anesthesia Postprocedure Evaluation (Signed)
Anesthesia Post Note  Patient: Whitney Glover  Procedure(s) Performed: Procedure(s) (LRB): CATARACT EXTRACTION PHACO AND INTRAOCULAR LENS PLACEMENT (IOC) (Left)  Patient location during evaluation: PACU Anesthesia Type: MAC Level of consciousness: awake Pain management: pain level controlled Vital Signs Assessment: post-procedure vital signs reviewed and stable Respiratory status: spontaneous breathing Cardiovascular status: stable Postop Assessment: no signs of nausea or vomiting and adequate PO intake Anesthetic complications: no    Last Vitals:  Filed Vitals:   02/06/16 0952 02/06/16 1007  BP: 119/49 129/55  Pulse: 49 55  Temp: 36.4 C   Resp: 18 18    Last Pain: There were no vitals filed for this visit.               [place]

## 2016-02-06 NOTE — Transfer of Care (Signed)
Immediate Anesthesia Transfer of Care Note  Patient: Whitney Glover  Procedure(s) Performed: Procedure(s) with comments: CATARACT EXTRACTION PHACO AND INTRAOCULAR LENS PLACEMENT (IOC) (Left) - [place] 01:13 AP% 27.3 CDE 33.25 fluid opack lot # [id number]  Patient Location: PACU and Short Stay  Anesthesia Type:MAC  Level of Consciousness: awake, oriented and patient cooperative  Airway & Oxygen Therapy: Patient Spontanous Breathing  Post-op Assessment: Report given to RN and Post -op Vital signs reviewed and stable  Post vital signs: Reviewed and stable  Last Vitals:  Filed Vitals:   02/06/16 0742  BP: 136/111  Pulse: 59  Temp: 36.6 C  Resp: 20    Complications: No apparent anesthesia complications

## 2016-04-18 ENCOUNTER — Other Ambulatory Visit: Payer: Self-pay | Admitting: *Deleted

## 2016-04-20 ENCOUNTER — Other Ambulatory Visit: Payer: Self-pay | Admitting: Family Medicine

## 2016-04-20 DIAGNOSIS — Z1231 Encounter for screening mammogram for malignant neoplasm of breast: Secondary | ICD-10-CM

## 2016-05-02 ENCOUNTER — Other Ambulatory Visit: Payer: Self-pay | Admitting: Family Medicine

## 2016-05-02 ENCOUNTER — Ambulatory Visit
Admission: RE | Admit: 2016-05-02 | Discharge: 2016-05-02 | Disposition: A | Discharge status: Home / Self Care | Payer: Medicare HMO | Source: Ambulatory Visit | Attending: Family Medicine | Admitting: Family Medicine

## 2016-05-02 DIAGNOSIS — Z1231 Encounter for screening mammogram for malignant neoplasm of breast: Secondary | ICD-10-CM

## 2017-04-15 ENCOUNTER — Emergency Department
Admission: EM | Admit: 2017-04-15 | Discharge: 2017-04-15 | Disposition: A | Discharge status: Home / Self Care | Payer: Medicare HMO | Attending: Student in an Organized Health Care Education/Training Program | Admitting: Student in an Organized Health Care Education/Training Program

## 2017-04-15 ENCOUNTER — Encounter: Payer: Self-pay | Admitting: Emergency Medicine

## 2017-04-15 ENCOUNTER — Emergency Department: Payer: Medicare HMO

## 2017-04-15 DIAGNOSIS — S299XXA Unspecified injury of thorax, initial encounter: Secondary | ICD-10-CM | POA: Diagnosis present

## 2017-04-15 DIAGNOSIS — Z7982 Long term (current) use of aspirin: Secondary | ICD-10-CM | POA: Insufficient documentation

## 2017-04-15 DIAGNOSIS — W19XXXA Unspecified fall, initial encounter: Secondary | ICD-10-CM

## 2017-04-15 DIAGNOSIS — Y92009 Unspecified place in unspecified non-institutional (private) residence as the place of occurrence of the external cause: Secondary | ICD-10-CM | POA: Insufficient documentation

## 2017-04-15 DIAGNOSIS — Y999 Unspecified external cause status: Secondary | ICD-10-CM | POA: Insufficient documentation

## 2017-04-15 DIAGNOSIS — R0789 Other chest pain: Secondary | ICD-10-CM | POA: Insufficient documentation

## 2017-04-15 DIAGNOSIS — Z79899 Other long term (current) drug therapy: Secondary | ICD-10-CM | POA: Insufficient documentation

## 2017-04-15 DIAGNOSIS — R109 Unspecified abdominal pain: Secondary | ICD-10-CM | POA: Diagnosis not present

## 2017-04-15 DIAGNOSIS — F17210 Nicotine dependence, cigarettes, uncomplicated: Secondary | ICD-10-CM | POA: Diagnosis not present

## 2017-04-15 DIAGNOSIS — S20222A Contusion of left back wall of thorax, initial encounter: Secondary | ICD-10-CM

## 2017-04-15 DIAGNOSIS — W182XXA Fall in (into) shower or empty bathtub, initial encounter: Secondary | ICD-10-CM | POA: Diagnosis not present

## 2017-04-15 DIAGNOSIS — Y939 Activity, unspecified: Secondary | ICD-10-CM | POA: Diagnosis not present

## 2017-04-15 LAB — CBC WITH DIFFERENTIAL/PLATELET
BASOS ABS: 0 10*3/uL (ref 0–0.1)
Basophils Relative: 0 %
EOS ABS: 0 10*3/uL (ref 0–0.7)
Eosinophils Relative: 1 %
HCT: 39 % (ref 35.0–47.0)
Hemoglobin: 13.4 g/dL (ref 12.0–16.0)
LYMPHS ABS: 1.7 10*3/uL (ref 1.0–3.6)
LYMPHS PCT: 26 %
MCH: 31.5 pg (ref 26.0–34.0)
MCHC: 34.4 g/dL (ref 32.0–36.0)
MCV: 91.7 fL (ref 80.0–100.0)
MONO ABS: 0.7 10*3/uL (ref 0.2–0.9)
Monocytes Relative: 11 %
Neutro Abs: 4 10*3/uL (ref 1.4–6.5)
Neutrophils Relative %: 62 %
PLATELETS: 272 10*3/uL (ref 150–440)
RBC: 4.26 MIL/uL (ref 3.80–5.20)
RDW: 12.9 % (ref 11.5–14.5)
WBC: 6.5 10*3/uL (ref 3.6–11.0)

## 2017-04-15 LAB — COMPREHENSIVE METABOLIC PANEL
ALT: 27 U/L (ref 14–54)
AST: 22 U/L (ref 15–41)
Albumin: 4.7 g/dL (ref 3.5–5.0)
Alkaline Phosphatase: 80 U/L (ref 38–126)
Anion gap: 7 (ref 5–15)
BUN: 12 mg/dL (ref 6–20)
CHLORIDE: 102 mmol/L (ref 101–111)
CO2: 28 mmol/L (ref 22–32)
Calcium: 10.6 mg/dL — ABNORMAL HIGH (ref 8.9–10.3)
Creatinine, Ser: 0.93 mg/dL (ref 0.44–1.00)
Glucose, Bld: 107 mg/dL — ABNORMAL HIGH (ref 65–99)
POTASSIUM: 4.4 mmol/L (ref 3.5–5.1)
SODIUM: 137 mmol/L (ref 135–145)
Total Bilirubin: 0.4 mg/dL (ref 0.3–1.2)
Total Protein: 7.5 g/dL (ref 6.5–8.1)

## 2017-04-15 MED ORDER — IPRATROPIUM-ALBUTEROL 0.5-2.5 (3) MG/3ML IN SOLN
3.0000 mL | Freq: Once | RESPIRATORY_TRACT | Status: AC
  Administered 2017-04-15: 3 mL via RESPIRATORY_TRACT
  Filled 2017-04-15: qty 3

## 2017-04-15 MED ORDER — TRAMADOL HCL 50 MG PO TABS
100.0000 mg | ORAL_TABLET | Freq: Once | ORAL | Status: AC
  Administered 2017-04-15: 100 mg via ORAL
  Filled 2017-04-15: qty 2

## 2017-04-15 MED ORDER — TRAMADOL HCL 50 MG PO TABS: 50.0000 mg | ORAL_TABLET | Freq: Four times a day (QID) | ORAL | 0 refills | Status: AC | PRN

## 2017-04-15 NOTE — ED Triage Notes (Addendum)
Pt reports she fell getting into the shower, states she is c/o back pain more on the left side, bruising noted to lower left back. Pt is ambulatory in triage. States the pain is worse when she takes a deep breath. Pt is talking in full sentences without running out of breath. Pt also states she has been smoking more since Saturday. She has been taking Ibuprofen 200mg  every 4 hours since Saturday. DENIES LOC. Did not hit her head.

## 2017-04-15 NOTE — ED Provider Notes (Signed)
[facility] Emergency Department Provider Note    First MD Initiated Contact with Patient 04/15/17 1536     (approximate)  I have reviewed the triage vital signs and the nursing notes.   HISTORY  Chief Complaint Fall    HPI Whitney Glover is a 71 y.o. female who presents with severe left sided thoracic and upper back painthat has been progressively worsening since Saturday when she fell and her shower at home. Did not hit her head. There is no LOC. She was able to manage for the past 2 days but has noticed worsening swelling and bruising to the left chest wall with pain when taking a deep breath. Patient is not on any blood thinners other than a baby aspirin. She denies any syncopal episode and this was a mechanical fall and she was slipping on a wet shower.   Past Medical History:  Diagnosis Date  . Arthritis   . Bronchitis   . Dizziness    WHEN LAYS FLAT  . Hepatitis    AS A TEENAGER  . Hypercholesterolemia   . Seasonal allergies   . Shortness of breath dyspnea    WITH EXERTION   Family History  Problem Relation Age of Onset  . Breast cancer Paternal Aunt 34   Past Surgical History:  Procedure Laterality Date  . ABDOMINAL HYSTERECTOMY     36  . CATARACT EXTRACTION W/PHACO Right 01/16/2016   Procedure: CATARACT EXTRACTION PHACO AND INTRAOCULAR LENS PLACEMENT (IOC);  Surgeon: Estill Cotta, MD;  Location: [facility] ORS;  Service: Ophthalmology;  Laterality: Right;  [place] 01:14 AP% 25.7 CDE 35.31 fluid pack lot # [id number]  . CATARACT EXTRACTION W/PHACO Left 02/06/2016   Procedure: CATARACT EXTRACTION PHACO AND INTRAOCULAR LENS PLACEMENT ([place]);  Surgeon: Estill Cotta, MD;  Location: [facility] ORS;  Service: Ophthalmology;  Laterality: Left;  [place] 01:13 AP% 27.3 CDE 33.25 fluid opack lot # [id number]  . CESAREAN SECTION     X3  . DILATION AND CURETTAGE OF UTERUS    . EYE SURGERY    . OOPHORECTOMY Bilateral    There are no active problems to  display for this patient.     Prior to Admission medications   Medication Sig Start Date End Date Taking? Authorizing Provider  albuterol (PROVENTIL HFA;VENTOLIN HFA) 108 (90 Base) MCG/ACT inhaler Inhale into the lungs every 6 (six) hours as needed for wheezing or shortness of breath.    [provider]  aspirin 81 MG tablet Take 81 mg by mouth daily.    [provider]  atorvastatin (LIPITOR) 10 MG tablet Take 10 mg by mouth daily.    [provider]  cholecalciferol (VITAMIN D) 1000 units tablet Take 1,000 Units by mouth daily.    [provider]  polyethylene glycol (MIRALAX / GLYCOLAX) packet Take 17 g by mouth daily as needed.    [provider]  traMADol (ULTRAM) 50 MG tablet Take 1 tablet (50 mg total) by mouth every 6 (six) hours as needed. 04/15/17 04/15/18  Merlyn Lot, MD    Allergies Erythromycin and Erythromycin base    Social History Social History  Substance Use Topics  . Smoking status: Current Every Day Smoker    Packs/day: 0.50    Types: Cigarettes  . Smokeless tobacco: Never Used  . Alcohol use No    Review of Systems Patient denies headaches, rhinorrhea, blurry vision, numbness, shortness of breath, chest pain, edema, cough, abdominal pain, nausea, vomiting, diarrhea, dysuria, fevers, rashes or  hallucinations unless otherwise stated above in HPI. ____________________________________________   PHYSICAL EXAM:  VITAL SIGNS: Vitals:   04/15/17 1516 04/15/17 1753  BP: (!) 158/62 140/70  Pulse: 61 65  Resp: 18   Temp: 98.3 F (36.8 C)     Constitutional: Alert and oriented. Well appearing and in no acute distress. Eyes: Conjunctivae are normal.  Head: Atraumatic. Nose: No congestion/rhinnorhea. Mouth/Throat: Mucous membranes are moist.  Oropharynx non-erythematous. Neck: No stridor. Painless ROM. No cervical spine tenderness to palpation Hematological/Lymphatic/Immunilogical: No cervical  lymphadenopathy. Cardiovascular: Normal rate, regular rhythm. Grossly normal heart sounds.  Good peripheral circulation. Respiratory: Normal respiratory effort.  No retractions. Lungs CTAB. Gastrointestinal: Soft and nontender. No distention. No abdominal bruits. No CVA tenderness. Musculoskeletal: No lower extremity tenderness nor edema.  No joint effusions. Neurologic:  Normal speech and language. No gross focal neurologic deficits are appreciated. No gait instability. Skin:  Skin is warm, dry and intact. No rash noted. Psychiatric: Mood and affect are normal. Speech and behavior are normal.  ____________________________________________   LABS (all labs ordered are listed, but only abnormal results are displayed)  Results for orders placed or performed during the hospital encounter of 04/15/17 (from the past 24 hour(s))  CBC with Differential/Platelet     Status: None   Collection Time: 04/15/17  4:35 PM  Result Value Ref Range   WBC 6.5 3.6 - 11.0 K/uL   RBC 4.26 3.80 - 5.20 MIL/uL   Hemoglobin 13.4 12.0 - 16.0 g/dL   HCT 39.0 35.0 - 47.0 %   MCV 91.7 80.0 - 100.0 fL   MCH 31.5 26.0 - 34.0 pg   MCHC 34.4 32.0 - 36.0 g/dL   RDW 12.9 11.5 - 14.5 %   Platelets 272 150 - 440 K/uL   Neutrophils Relative % 62 %   Neutro Abs 4.0 1.4 - 6.5 K/uL   Lymphocytes Relative 26 %   Lymphs Abs 1.7 1.0 - 3.6 K/uL   Monocytes Relative 11 %   Monocytes Absolute 0.7 0.2 - 0.9 K/uL   Eosinophils Relative 1 %   Eosinophils Absolute 0.0 0 - 0.7 K/uL   Basophils Relative 0 %   Basophils Absolute 0.0 0 - 0.1 K/uL  Comprehensive metabolic panel     Status: Abnormal   Collection Time: 04/15/17  4:35 PM  Result Value Ref Range   Sodium 137 135 - 145 mmol/L   Potassium 4.4 3.5 - 5.1 mmol/L   Chloride 102 101 - 111 mmol/L   CO2 28 22 - 32 mmol/L   Glucose, Bld 107 (H) 65 - 99 mg/dL   BUN 12 6 - 20 mg/dL   Creatinine, Ser 0.93 0.44 - 1.00 mg/dL   Calcium 10.6 (H) 8.9 - 10.3 mg/dL   Total Protein  7.5 6.5 - 8.1 g/dL   Albumin 4.7 3.5 - 5.0 g/dL   AST 22 15 - 41 U/L   ALT 27 14 - 54 U/L   Alkaline Phosphatase 80 38 - 126 U/L   Total Bilirubin 0.4 0.3 - 1.2 mg/dL   GFR calc non Af Amer >60 >60 mL/min   GFR calc Af Amer >60 >60 mL/min   Anion gap 7 5 - 15   ____________________________________________ ___________________________________  RADIOLOGY  I personally reviewed all radiographic images ordered to evaluate for the above acute complaints and reviewed radiology reports and findings.  These findings were personally discussed with the patient.  Please see medical record for radiology report.  ____________________________________________   PROCEDURES  Procedure(s) performed:  Procedures    Critical Care performed: no ____________________________________________   INITIAL IMPRESSION / ASSESSMENT AND PLAN / ED COURSE  Pertinent labs & imaging results that were available during my care of the patient were reviewed by me and considered in my medical decision making (see chart for details).  DDX: fracture, contusion, htx, ptx, splenic injury  Whitney Glover is a 71 y.o. who presents to the ED with left thoracic chest wall pain as described above. Patient otherwise hemodynamically stable and well-appearing. Her abdominal exam is benign but does have ecchymosis over the left flank. CT imaging ordered to evaluate for acute traumatic injury shows none. Her blood work is reassuring. Do suspect contusion but no evidence of pneumonia, pneumothorax or hemothorax importantly. Normal-appearing spleen. Patient able to and related with a steady gait. Pain improvement with oral medications. Provided with incentive spirometer. Patient stable for close follow-up with PCP.      ____________________________________________   FINAL CLINICAL IMPRESSION(S) / ED DIAGNOSES  Final diagnoses:  Fall, initial encounter  Left-sided chest wall pain  Contusion of left back wall of  thorax, initial encounter      NEW MEDICATIONS STARTED DURING THIS VISIT:  Discharge Medication List as of 04/15/2017  5:42 PM    START taking these medications   Details  traMADol (ULTRAM) 50 MG tablet Take 1 tablet (50 mg total) by mouth every 6 (six) hours as needed., Starting Mon 04/15/2017, Until Tue 04/15/2018, Print         Note:  This document was prepared using Dragon voice recognition software and may include unintentional dictation errors.    Merlyn Lot, MD 04/15/17 [phone]

## 2017-04-15 NOTE — ED Notes (Signed)
Patient given incentive spirometer and instructed on use. Patient demonstrated proper use and verbalized understanding.

## 2017-04-15 NOTE — ED Notes (Signed)
Patient transported to CT 

## 2017-05-13 ENCOUNTER — Other Ambulatory Visit: Payer: Self-pay | Admitting: Family Medicine

## 2017-05-13 DIAGNOSIS — Z1231 Encounter for screening mammogram for malignant neoplasm of breast: Secondary | ICD-10-CM

## 2017-05-23 ENCOUNTER — Ambulatory Visit
Admission: RE | Admit: 2017-05-23 | Discharge: 2017-05-23 | Disposition: A | Discharge status: Home / Self Care | Payer: Medicare HMO | Source: Ambulatory Visit | Attending: Family Medicine | Admitting: Family Medicine

## 2017-05-23 DIAGNOSIS — Z1231 Encounter for screening mammogram for malignant neoplasm of breast: Secondary | ICD-10-CM

## 2018-02-04 ENCOUNTER — Other Ambulatory Visit: Payer: Self-pay | Admitting: Family Medicine

## 2018-02-04 DIAGNOSIS — R911 Solitary pulmonary nodule: Secondary | ICD-10-CM

## 2018-02-04 DIAGNOSIS — E7800 Pure hypercholesterolemia, unspecified: Secondary | ICD-10-CM

## 2018-02-14 ENCOUNTER — Ambulatory Visit: Admission: RE | Admit: 2018-02-14 | Payer: Medicare HMO | Source: Ambulatory Visit

## 2018-02-27 ENCOUNTER — Ambulatory Visit: Admission: RE | Admit: 2018-02-27 | Payer: Medicare HMO | Source: Ambulatory Visit

## 2018-03-11 ENCOUNTER — Ambulatory Visit: Admission: RE | Admit: 2018-03-11 | Payer: Medicare HMO | Source: Ambulatory Visit

## 2018-05-16 ENCOUNTER — Other Ambulatory Visit: Payer: Self-pay | Admitting: Family Medicine

## 2018-05-16 DIAGNOSIS — Z1231 Encounter for screening mammogram for malignant neoplasm of breast: Secondary | ICD-10-CM

## 2018-06-06 ENCOUNTER — Ambulatory Visit
Admission: RE | Admit: 2018-06-06 | Discharge: 2018-06-06 | Disposition: A | Discharge status: Home / Self Care | Payer: Medicare HMO | Source: Ambulatory Visit | Attending: Family Medicine | Admitting: Family Medicine

## 2018-06-06 DIAGNOSIS — Z1231 Encounter for screening mammogram for malignant neoplasm of breast: Secondary | ICD-10-CM | POA: Diagnosis present

## 2018-06-13 ENCOUNTER — Encounter: Payer: Self-pay | Admitting: *Deleted

## 2018-06-16 ENCOUNTER — Encounter: Payer: Self-pay | Admitting: Student

## 2018-06-16 ENCOUNTER — Ambulatory Visit: Payer: Medicare HMO | Admitting: Anesthesiology

## 2018-06-16 ENCOUNTER — Encounter
Admission: RE | Disposition: A | Discharge status: Home / Self Care | Payer: Self-pay | Source: Ambulatory Visit | Attending: Unknown Physician Specialty

## 2018-06-16 ENCOUNTER — Other Ambulatory Visit: Payer: Self-pay

## 2018-06-16 ENCOUNTER — Ambulatory Visit
Admission: RE | Admit: 2018-06-16 | Discharge: 2018-06-16 | Disposition: A | Discharge status: Home / Self Care | Payer: Medicare HMO | Source: Ambulatory Visit | Attending: Unknown Physician Specialty | Admitting: Unknown Physician Specialty

## 2018-06-16 DIAGNOSIS — D122 Benign neoplasm of ascending colon: Secondary | ICD-10-CM | POA: Diagnosis not present

## 2018-06-16 DIAGNOSIS — E7800 Pure hypercholesterolemia, unspecified: Secondary | ICD-10-CM | POA: Diagnosis not present

## 2018-06-16 DIAGNOSIS — Z87891 Personal history of nicotine dependence: Secondary | ICD-10-CM | POA: Diagnosis not present

## 2018-06-16 DIAGNOSIS — K5730 Diverticulosis of large intestine without perforation or abscess without bleeding: Secondary | ICD-10-CM | POA: Insufficient documentation

## 2018-06-16 DIAGNOSIS — Z79899 Other long term (current) drug therapy: Secondary | ICD-10-CM | POA: Diagnosis not present

## 2018-06-16 DIAGNOSIS — Z1211 Encounter for screening for malignant neoplasm of colon: Secondary | ICD-10-CM | POA: Diagnosis present

## 2018-06-16 DIAGNOSIS — K760 Fatty (change of) liver, not elsewhere classified: Secondary | ICD-10-CM | POA: Diagnosis not present

## 2018-06-16 DIAGNOSIS — K640 First degree hemorrhoids: Secondary | ICD-10-CM | POA: Diagnosis not present

## 2018-06-16 HISTORY — DX: Hyperlipidemia, unspecified: E78.5

## 2018-06-16 HISTORY — DX: Age-related osteoporosis without current pathological fracture: M81.0

## 2018-06-16 HISTORY — DX: Unspecified asthma, uncomplicated: J45.909

## 2018-06-16 HISTORY — DX: Hypercalcemia: E83.52

## 2018-06-16 HISTORY — DX: Fatty (change of) liver, not elsewhere classified: K76.0

## 2018-06-16 HISTORY — PX: COLONOSCOPY WITH PROPOFOL: SHX5780

## 2018-06-16 SURGERY — COLONOSCOPY WITH PROPOFOL
Anesthesia: General

## 2018-06-16 MED ORDER — LIDOCAINE HCL (PF) 2 % IJ SOLN
INTRAMUSCULAR | Status: AC
  Filled 2018-06-16: qty 10

## 2018-06-16 MED ORDER — PROPOFOL 500 MG/50ML IV EMUL
INTRAVENOUS | Status: AC
  Filled 2018-06-16: qty 50

## 2018-06-16 MED ORDER — LIDOCAINE HCL (CARDIAC) PF 100 MG/5ML IV SOSY
PREFILLED_SYRINGE | INTRAVENOUS | Status: DC | PRN
  Administered 2018-06-16: 60 mg via INTRAVENOUS

## 2018-06-16 MED ORDER — SODIUM CHLORIDE 0.9 % IV SOLN
INTRAVENOUS | Status: DC
  Administered 2018-06-16: 15:00:00 via INTRAVENOUS

## 2018-06-16 MED ORDER — SODIUM CHLORIDE 0.9 % IV SOLN: INTRAVENOUS | Status: DC

## 2018-06-16 MED ORDER — PROPOFOL 10 MG/ML IV BOLUS
INTRAVENOUS | Status: DC | PRN
  Administered 2018-06-16: 60 mg via INTRAVENOUS

## 2018-06-16 MED ORDER — PROPOFOL 500 MG/50ML IV EMUL
INTRAVENOUS | Status: DC | PRN
  Administered 2018-06-16: 120 ug/kg/min via INTRAVENOUS

## 2018-06-16 NOTE — Anesthesia Postprocedure Evaluation (Signed)
Anesthesia Post Note  Patient: Whitney Glover  Procedure(s) Performed: COLONOSCOPY WITH PROPOFOL (N/A )  Patient location during evaluation: PACU Anesthesia Type: General Level of consciousness: awake and alert Pain management: pain level controlled Vital Signs Assessment: post-procedure vital signs reviewed and stable Respiratory status: spontaneous breathing, nonlabored ventilation, respiratory function stable and patient connected to nasal cannula oxygen Cardiovascular status: blood pressure returned to baseline and stable Postop Assessment: no apparent nausea or vomiting Anesthetic complications: no     Last Vitals:  Vitals:   06/16/18 1626 06/16/18 1628  BP: (!) 133/56 (!) 133/56  Pulse: (!) 56 (!) 54  Resp: 18 15  Temp:    SpO2: 100% 98%    Last Pain:  Vitals:   06/16/18 1628  TempSrc:   PainSc: 0-No pain                 Molli Barrows

## 2018-06-16 NOTE — Anesthesia Post-op Follow-up Note (Signed)
Anesthesia QCDR form completed.        

## 2018-06-16 NOTE — Anesthesia Preprocedure Evaluation (Signed)
Anesthesia Evaluation  Patient identified by MRN, date of birth, ID band Patient awake    Reviewed: Allergy & Precautions, H&P , NPO status , Patient's Chart, lab work & pertinent test results, reviewed documented beta blocker date and time   Airway Mallampati: II   Neck ROM: full    Dental  (+) Poor Dentition, Teeth Intact   Pulmonary neg pulmonary ROS, shortness of breath, asthma , former smoker,    Pulmonary exam normal        Cardiovascular Exercise Tolerance: Good negative cardio ROS Normal cardiovascular exam Rhythm:regular Rate:Normal     Neuro/Psych negative neurological ROS  negative psych ROS   GI/Hepatic negative GI ROS, Neg liver ROS, (+) Hepatitis -  Endo/Other  negative endocrine ROS  Renal/GU negative Renal ROS  negative genitourinary   Musculoskeletal   Abdominal   Peds  Hematology negative hematology ROS (+)   Anesthesia Other Findings Past Medical History: No date: Arthritis No date: Asthma No date: Bronchitis No date: Dizziness     Comment:  WHEN LAYS FLAT No date: Fatty liver No date: Hepatitis     Comment:  AS A TEENAGER No date: Hypercalcemia No date: Hypercholesterolemia No date: Hyperlipidemia No date: Osteoporosis No date: Seasonal allergies No date: Shortness of breath dyspnea     Comment:  WITH EXERTION Past Surgical History: No date: ABDOMINAL HYSTERECTOMY     Comment:  36 01/16/2016: CATARACT EXTRACTION W/PHACO; Right     Comment:  Procedure: CATARACT EXTRACTION PHACO AND INTRAOCULAR               LENS PLACEMENT (IOC);  Surgeon: Estill Cotta, MD;                Location: [facility] ORS;  Service: Ophthalmology;  Laterality:              Right;  [place] 01:14 AP% 25.7 CDE 35.31 fluid pack lot #               [id number] 02/06/2016: CATARACT EXTRACTION W/PHACO; Left     Comment:  Procedure: CATARACT EXTRACTION PHACO AND INTRAOCULAR               LENS PLACEMENT (IOC);  Surgeon:  Estill Cotta, MD;                Location: [facility] ORS;  Service: Ophthalmology;  Laterality:              Left;  [place] 01:13 AP% 27.3 CDE 33.25 fluid opack lot #               [id number] No date: CESAREAN SECTION     Comment:  X3 No date: DILATION AND CURETTAGE OF UTERUS No date: EYE SURGERY No date: OOPHORECTOMY; Bilateral BMI    Body Mass Index:  29.29 kg/m     Reproductive/Obstetrics negative OB ROS                             Anesthesia Physical Anesthesia Plan  ASA: III  Anesthesia Plan: General   Post-op Pain Management:    Induction:   PONV Risk Score and Plan:   Airway Management Planned:   Additional Equipment:   Intra-op Plan:   Post-operative Plan:   Informed Consent: I have reviewed the patients History and Physical, chart, labs and discussed the procedure including the risks, benefits and alternatives for the proposed anesthesia with the patient or authorized representative who has indicated his/her  understanding and acceptance.   Dental Advisory Given  Plan Discussed with: CRNA  Anesthesia Plan Comments:         Anesthesia Quick Evaluation

## 2018-06-16 NOTE — H&P (Signed)
Primary Care Physician:  [facility] Primary Gastroenterologist:  Dr. Vira Agar  Pre-Procedure History & Physical: HPI:  Whitney Glover is a 72 y.o. female is here for an colonoscopy.  For personal history of colon polyps.   Past Medical History:  Diagnosis Date  . Arthritis   . Asthma   . Bronchitis   . Dizziness    WHEN LAYS FLAT  . Fatty liver   . Hepatitis    AS A TEENAGER  . Hypercalcemia   . Hypercholesterolemia   . Hyperlipidemia   . Osteoporosis   . Seasonal allergies   . Shortness of breath dyspnea    WITH EXERTION    Past Surgical History:  Procedure Laterality Date  . ABDOMINAL HYSTERECTOMY     36  . CATARACT EXTRACTION W/PHACO Right 01/16/2016   Procedure: CATARACT EXTRACTION PHACO AND INTRAOCULAR LENS PLACEMENT (IOC);  Surgeon: Estill Cotta, MD;  Location: [facility] ORS;  Service: Ophthalmology;  Laterality: Right;  [place] 01:14 AP% 25.7 CDE 35.31 fluid pack lot # [id number]  . CATARACT EXTRACTION W/PHACO Left 02/06/2016   Procedure: CATARACT EXTRACTION PHACO AND INTRAOCULAR LENS PLACEMENT ([place]);  Surgeon: Estill Cotta, MD;  Location: [facility] ORS;  Service: Ophthalmology;  Laterality: Left;  [place] 01:13 AP% 27.3 CDE 33.25 fluid opack lot # [id number]  . CESAREAN SECTION     X3  . DILATION AND CURETTAGE OF UTERUS    . EYE SURGERY    . OOPHORECTOMY Bilateral     Prior to Admission medications   Medication Sig Start Date End Date Taking? Authorizing Provider  albuterol (PROVENTIL HFA;VENTOLIN HFA) 108 (90 Base) MCG/ACT inhaler Inhale into the lungs every 6 (six) hours as needed for wheezing or shortness of breath.    [provider]  aspirin 81 MG tablet Take 81 mg by mouth daily.    [provider]  atorvastatin (LIPITOR) 10 MG tablet Take 10 mg by mouth daily.    [provider]  cholecalciferol (VITAMIN D) 1000 units tablet Take 1,000 Units by mouth daily.    [provider]  polyethylene glycol (MIRALAX /  GLYCOLAX) packet Take 17 g by mouth daily as needed.    [provider]    Allergies as of 04/16/2018 - Review Complete 04/15/2017  Allergen Reaction Noted  . Erythromycin Nausea And Vomiting 04/07/2015  . Erythromycin base Other (See Comments) 02/03/2016    Family History  Problem Relation Age of Onset  . Breast cancer Paternal Aunt 69    Social History   Socioeconomic History  . Marital status: Married    Spouse name: Not on file  . Number of children: Not on file  . Years of education: Not on file  . Highest education level: Not on file  Occupational History  . Not on file  Social Needs  . Financial resource strain: Not on file  . Food insecurity:    Worry: Not on file    Inability: Not on file  . Transportation needs:    Medical: Not on file    Non-medical: Not on file  Tobacco Use  . Smoking status: Former Smoker    Packs/day: 0.50    Types: Cigarettes    Last attempt to quit: 02/17/2018    Years since quitting: 0.3  . Smokeless tobacco: Never Used  Substance and Sexual Activity  . Alcohol use: No  . Drug use: Not on file  . Sexual activity: Not on file  Lifestyle  . Physical activity:  Days per week: Not on file    Minutes per session: Not on file  . Stress: Not on file  Relationships  . Social connections:    Talks on phone: Not on file    Gets together: Not on file    Attends religious service: Not on file    Active member of club or organization: Not on file    Attends meetings of clubs or organizations: Not on file    Relationship status: Not on file  . Intimate partner violence:    Fear of current or ex partner: Not on file    Emotionally abused: Not on file    Physically abused: Not on file    Forced sexual activity: Not on file  Other Topics Concern  . Not on file  Social History Narrative  . Not on file    Review of Systems: See HPI, otherwise negative ROS  Physical Exam: BP 105/70   Pulse 64   Temp (!) 96.6 F (35.9 C)  (Tympanic)   Resp 16   Ht 5\' 1"  (1.549 m)   Wt 70.3 kg (155 lb)   SpO2 98%   BMI 29.29 kg/m  General:   Alert,  pleasant and cooperative in NAD Head:  Normocephalic and atraumatic. Neck:  Supple; no masses or thyromegaly. Lungs:  Clear throughout to auscultation.    Heart:  Regular rate and rhythm. Abdomen:  Soft, nontender and nondistended. Normal bowel sounds, without guarding, and without rebound.   Neurologic:  Alert and  oriented x4;  grossly normal neurologically.  Impression/Plan: Whitney Glover is here for an colonoscopy to be performed for [facility] colon polyps.  Risks, benefits, limitations, and alternatives regarding  colonoscopy have been reviewed with the patient.  Questions have been answered.  All parties agreeable.   Gaylyn Cheers, MD  06/16/2018, 3:21 PM

## 2018-06-16 NOTE — Op Note (Signed)
[facility] Gastroenterology Patient Name: Whitney Glover Procedure Date: 06/16/2018 3:17 PM MRN: [id number] Account #: [id number] Date of Birth: 1946/02/04 Admit Type: Outpatient Age: 72 Room: [facility] ENDO ROOM 1 Gender: Female Note Status: Finalized Procedure:            Colonoscopy Indications:          High risk colon cancer surveillance: Personal history                        of colonic polyps Providers:            Manya Silvas, MD Referring MD:         Gayland Curry MD, MD (Referring MD) Medicines:            Propofol per Anesthesia Complications:        No immediate complications. Procedure:            Pre-Anesthesia Assessment:                       - After reviewing the risks and benefits, the patient                        was deemed in satisfactory condition to undergo the                        procedure.                       After obtaining informed consent, the colonoscope was                        passed under direct vision. Throughout the procedure,                        the patient's blood pressure, pulse, and oxygen                        saturations were monitored continuously. The                        Colonoscope was introduced through the anus and                        advanced to the the cecum, identified by appendiceal                        orifice and ileocecal valve. The colonoscopy was                        performed without difficulty. The patient tolerated the                        procedure well. The quality of the bowel preparation                        was excellent. Findings:      A small polyp was found in the ascending colon. The polyp was sessile.       The polyp was removed with a hot snare. Resection and retrieval were       complete.      Many small and large-mouthed diverticula  were found in the sigmoid colon       and descending colon.      Internal hemorrhoids were found during endoscopy. The  hemorrhoids were       small and Grade I (internal hemorrhoids that do not prolapse).      The exam was otherwise without abnormality. Impression:           - One small polyp in the ascending colon, removed with                        a hot snare. Resected and retrieved.                       - Diverticulosis in the sigmoid colon and in the                        descending colon.                       - Internal hemorrhoids.                       - The examination was otherwise normal. Recommendation:       - Await pathology results. Manya Silvas, MD 06/16/2018 3:55:26 PM This report has been signed electronically. Number of Addenda: 0 Note Initiated On: 06/16/2018 3:17 PM Scope Withdrawal Time: 0 hours 8 minutes 14 seconds  Total Procedure Duration: 0 hours 19 minutes 47 seconds       [facility]

## 2018-06-16 NOTE — Transfer of Care (Signed)
Immediate Anesthesia Transfer of Care Note  Patient: Whitney Glover  Procedure(s) Performed: COLONOSCOPY WITH PROPOFOL (N/A )  Patient Location: PACU  Anesthesia Type:General  Level of Consciousness: sedated  Airway & Oxygen Therapy: Patient Spontanous Breathing and Patient connected to nasal cannula oxygen  Post-op Assessment: Report given to RN and Post -op Vital signs reviewed and stable  Post vital signs: Reviewed and stable  Last Vitals:  Vitals Value Taken Time  BP 107/49 06/16/2018  3:56 PM  Temp    Pulse 77 06/16/2018  3:57 PM  Resp 20 06/16/2018  3:57 PM  SpO2 100 % 06/16/2018  3:57 PM  Vitals shown include unvalidated device data.  Last Pain:  Vitals:   06/16/18 1445  TempSrc: Tympanic  PainSc: 0-No pain         Complications: No apparent anesthesia complications

## 2018-06-18 LAB — SURGICAL PATHOLOGY

## 2019-06-08 ENCOUNTER — Other Ambulatory Visit: Payer: Self-pay | Admitting: Family Medicine

## 2019-06-08 DIAGNOSIS — Z1231 Encounter for screening mammogram for malignant neoplasm of breast: Secondary | ICD-10-CM

## 2019-07-10 ENCOUNTER — Ambulatory Visit
Admission: RE | Admit: 2019-07-10 | Discharge: 2019-07-10 | Disposition: A | Discharge status: Home / Self Care | Payer: Medicare HMO | Source: Ambulatory Visit | Attending: Family Medicine | Admitting: Family Medicine

## 2019-07-10 DIAGNOSIS — Z1231 Encounter for screening mammogram for malignant neoplasm of breast: Secondary | ICD-10-CM | POA: Insufficient documentation

## 2019-11-15 IMAGING — MG MM DIGITAL SCREENING BILAT W/ TOMO W/ CAD
6 of 10 series · 6 of 30 positions shown · non-contrast
Comparison: Previous exam(s).

CLINICAL DATA: Screening.

EXAM:
DIGITAL SCREENING BILATERAL MAMMOGRAM WITH TOMO AND CAD

[L CC synth-2D (1 of 2)]
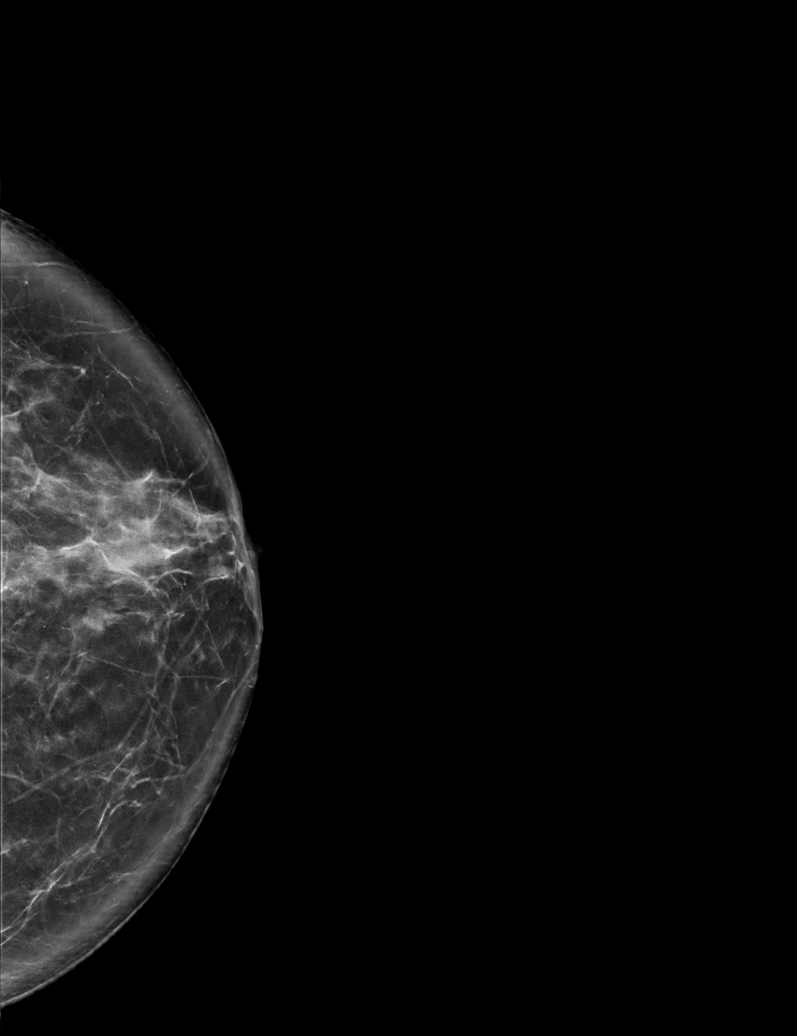

[R MLO synth-2D]
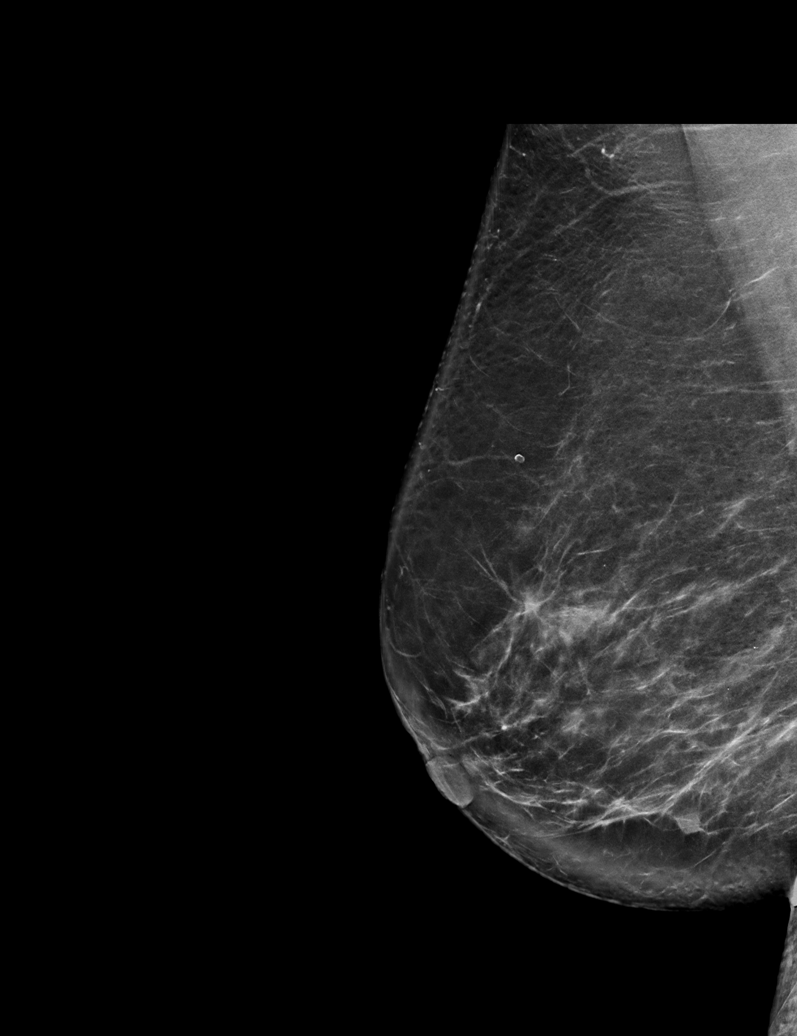

[R CC synth-2D]
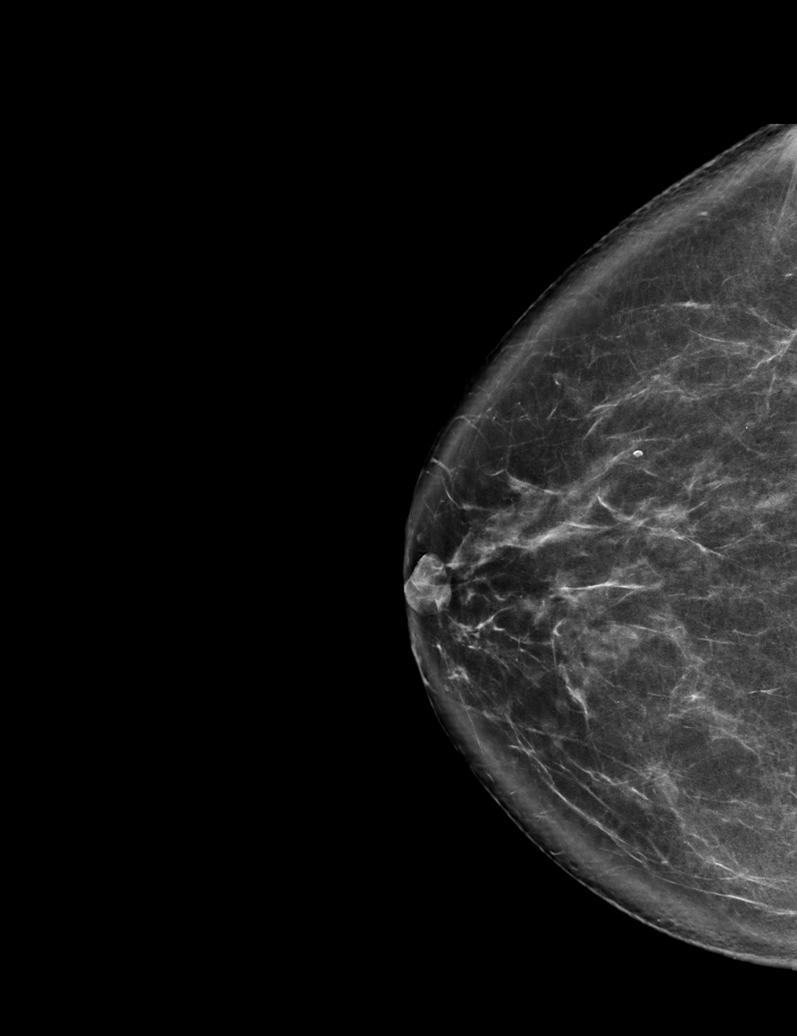

[L CC synth-2D (2 of 2)]
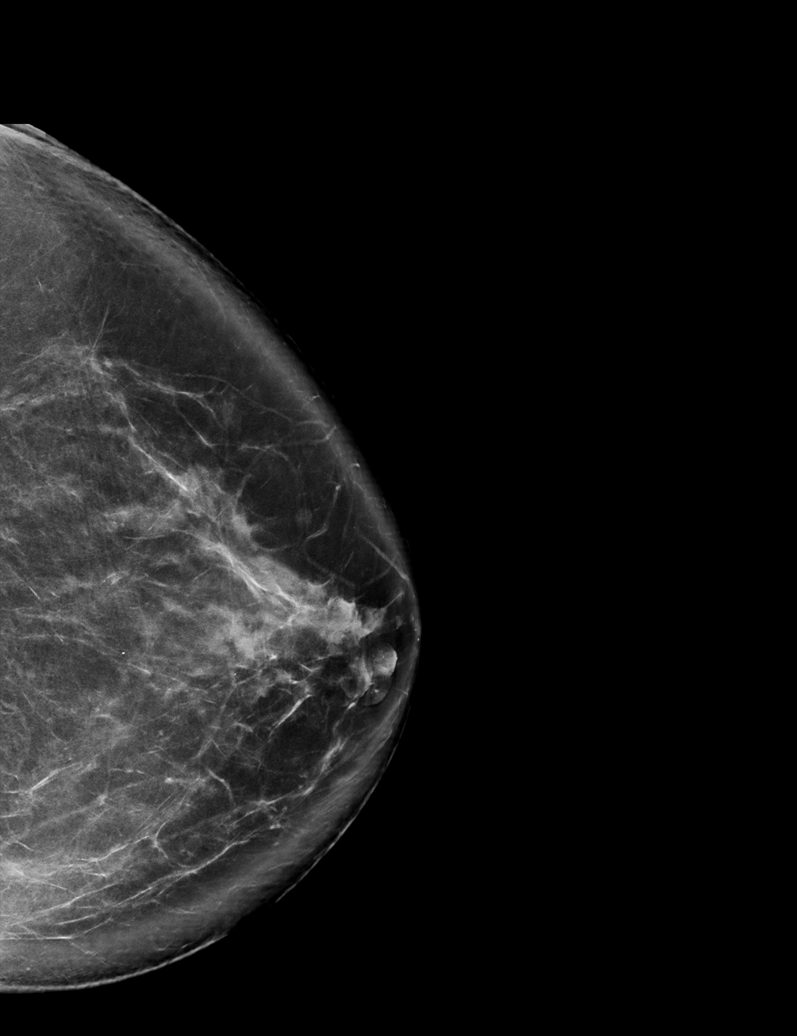

[L MLO synth-2D]
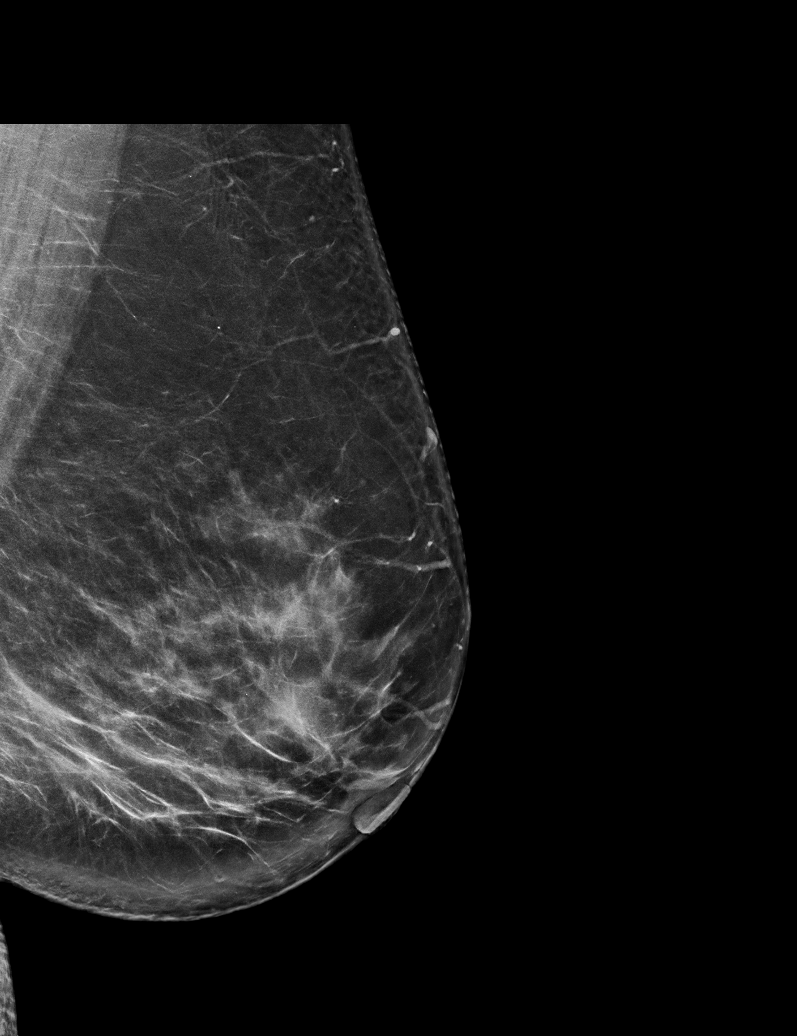

[L CC tomo · tomo slice 45/90.0]
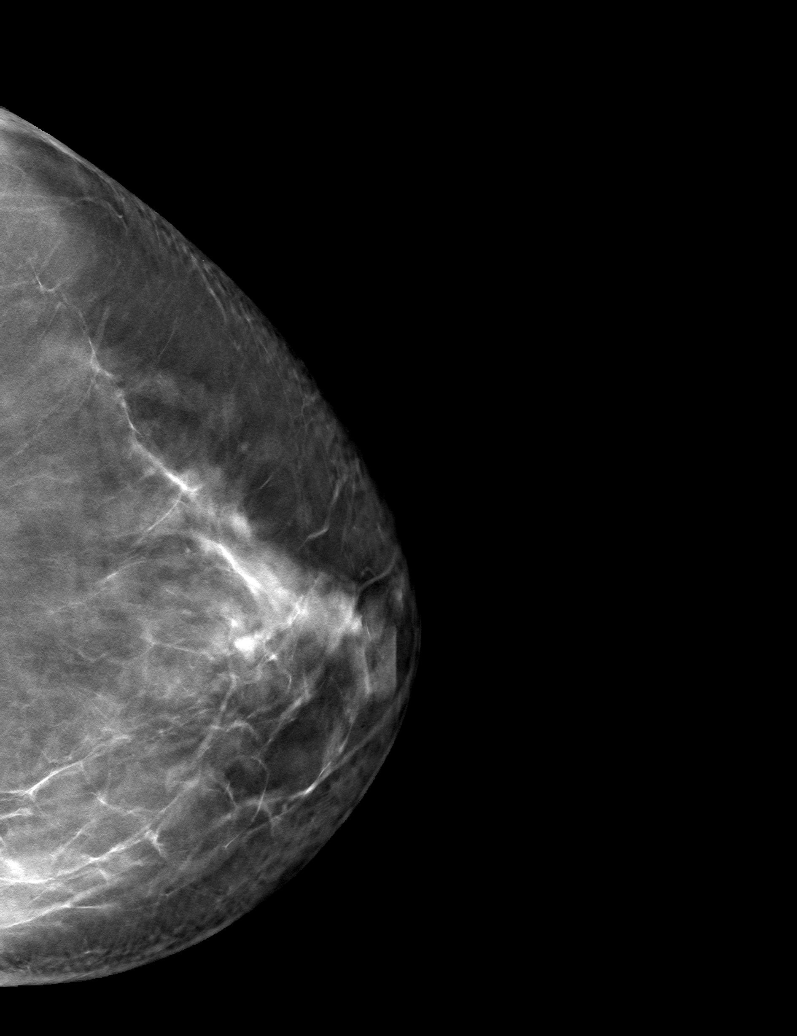

[6 of 30 positions shown; findings below may reference images not displayed]

ACR Breast Density Category c: The breast tissue is heterogeneously
dense, which may obscure small masses.
FINDINGS: There are no findings suspicious for malignancy. Images were
processed with CAD.
IMPRESSION: No mammographic evidence of malignancy. A result letter of this
screening mammogram will be mailed directly to the patient.

RECOMMENDATION:
Screening mammogram in one year. (Code:[id number])

BI-RADS CATEGORY  1: Negative.

## 2020-02-17 DIAGNOSIS — D473 Essential (hemorrhagic) thrombocythemia: Secondary | ICD-10-CM | POA: Diagnosis not present

## 2020-04-11 DIAGNOSIS — D473 Essential (hemorrhagic) thrombocythemia: Secondary | ICD-10-CM | POA: Diagnosis not present

## 2020-07-11 ENCOUNTER — Other Ambulatory Visit: Payer: Self-pay | Admitting: Family Medicine

## 2020-07-11 DIAGNOSIS — Z1231 Encounter for screening mammogram for malignant neoplasm of breast: Secondary | ICD-10-CM

## 2020-07-29 ENCOUNTER — Ambulatory Visit
Admission: RE | Admit: 2020-07-29 | Discharge: 2020-07-29 | Disposition: A | Discharge status: Home / Self Care | Payer: Medicare HMO | Source: Ambulatory Visit | Attending: Family Medicine | Admitting: Family Medicine

## 2020-07-29 DIAGNOSIS — Z1231 Encounter for screening mammogram for malignant neoplasm of breast: Secondary | ICD-10-CM

## 2020-08-17 LAB — CBC AND DIFFERENTIAL
HCT: 36 (ref 36–46)
Hemoglobin: 11.9 — AB (ref 12.0–16.0)
Neutrophils Absolute: 1890
Platelets: 339 (ref 150–399)
WBC: 3.2

## 2020-08-17 LAB — HEPATIC FUNCTION PANEL
ALT: 11 (ref 7–35)
AST: 21 (ref 13–35)
Alkaline Phosphatase: 63 (ref 25–125)
Bilirubin, Total: 0.5

## 2020-08-17 LAB — CBC: RBC: 3.75 — AB (ref 3.87–5.11)

## 2020-08-22 LAB — BASIC METABOLIC PANEL
BUN: 15 (ref 4–21)
CO2: 26 — AB (ref 13–22)
Chloride: 103 (ref 99–108)
Creatinine: 0.7 (ref 0.5–1.1)
Glucose: 133
Potassium: 4 (ref 3.4–5.3)
Sodium: 136 — AB (ref 137–147)

## 2020-08-22 LAB — COMPREHENSIVE METABOLIC PANEL
Albumin: 4.3 (ref 3.5–5.0)
Calcium: 9.3 (ref 8.7–10.7)

## 2020-08-23 ENCOUNTER — Other Ambulatory Visit: Payer: Self-pay | Admitting: Hematology and Oncology

## 2021-06-21 ENCOUNTER — Other Ambulatory Visit: Payer: Self-pay | Admitting: Family Medicine

## 2021-06-21 DIAGNOSIS — Z1231 Encounter for screening mammogram for malignant neoplasm of breast: Secondary | ICD-10-CM

## 2021-08-01 ENCOUNTER — Other Ambulatory Visit: Payer: Self-pay

## 2021-08-01 ENCOUNTER — Ambulatory Visit
Admission: RE | Admit: 2021-08-01 | Discharge: 2021-08-01 | Disposition: A | Discharge status: Home / Self Care | Payer: Medicare HMO | Source: Ambulatory Visit | Attending: Family Medicine | Admitting: Family Medicine

## 2021-08-01 DIAGNOSIS — Z1231 Encounter for screening mammogram for malignant neoplasm of breast: Secondary | ICD-10-CM | POA: Insufficient documentation

## 2021-08-08 ENCOUNTER — Other Ambulatory Visit: Payer: Self-pay | Admitting: Family Medicine

## 2021-08-08 DIAGNOSIS — R928 Other abnormal and inconclusive findings on diagnostic imaging of breast: Secondary | ICD-10-CM

## 2021-08-08 DIAGNOSIS — N6489 Other specified disorders of breast: Secondary | ICD-10-CM

## 2021-08-11 ENCOUNTER — Ambulatory Visit
Admission: RE | Admit: 2021-08-11 | Discharge: 2021-08-11 | Disposition: A | Discharge status: Home / Self Care | Payer: Medicare HMO | Source: Ambulatory Visit | Attending: Family Medicine | Admitting: Family Medicine

## 2021-08-11 ENCOUNTER — Other Ambulatory Visit: Payer: Self-pay

## 2021-08-11 DIAGNOSIS — R928 Other abnormal and inconclusive findings on diagnostic imaging of breast: Secondary | ICD-10-CM

## 2021-08-11 DIAGNOSIS — N6489 Other specified disorders of breast: Secondary | ICD-10-CM | POA: Diagnosis present

## 2022-03-29 ENCOUNTER — Other Ambulatory Visit: Payer: Self-pay | Admitting: Family Medicine

## 2022-03-29 DIAGNOSIS — Z1231 Encounter for screening mammogram for malignant neoplasm of breast: Secondary | ICD-10-CM

## 2022-08-06 ENCOUNTER — Ambulatory Visit
Admission: RE | Admit: 2022-08-06 | Discharge: 2022-08-06 | Disposition: A | Discharge status: Home / Self Care | Payer: Medicare HMO | Source: Ambulatory Visit | Attending: Family Medicine | Admitting: Family Medicine

## 2022-08-06 DIAGNOSIS — Z1231 Encounter for screening mammogram for malignant neoplasm of breast: Secondary | ICD-10-CM | POA: Insufficient documentation

## 2023-07-02 ENCOUNTER — Other Ambulatory Visit: Payer: Self-pay | Admitting: Pulmonary Disease

## 2023-07-02 DIAGNOSIS — Z87891 Personal history of nicotine dependence: Secondary | ICD-10-CM

## 2023-07-26 ENCOUNTER — Ambulatory Visit
Admission: RE | Admit: 2023-07-26 | Discharge: 2023-07-26 | Disposition: A | Discharge status: Home / Self Care | Payer: Medicare HMO | Source: Ambulatory Visit | Attending: Pulmonary Disease | Admitting: Pulmonary Disease

## 2023-07-26 DIAGNOSIS — Z87891 Personal history of nicotine dependence: Secondary | ICD-10-CM | POA: Diagnosis present

## 2023-08-15 ENCOUNTER — Other Ambulatory Visit: Payer: Self-pay | Admitting: Family Medicine

## 2023-08-15 DIAGNOSIS — Z1231 Encounter for screening mammogram for malignant neoplasm of breast: Secondary | ICD-10-CM

## 2023-08-22 ENCOUNTER — Ambulatory Visit
Admission: RE | Admit: 2023-08-22 | Discharge: 2023-08-22 | Disposition: A | Discharge status: Home / Self Care | Payer: Medicare HMO | Source: Ambulatory Visit | Attending: Family Medicine | Admitting: Family Medicine

## 2023-08-22 DIAGNOSIS — Z1231 Encounter for screening mammogram for malignant neoplasm of breast: Secondary | ICD-10-CM | POA: Insufficient documentation

## 2024-02-26 ENCOUNTER — Ambulatory Visit
Admission: EM | Admit: 2024-02-26 | Discharge: 2024-02-26 | Disposition: A | Discharge status: Home / Self Care | Attending: Emergency Medicine | Admitting: Emergency Medicine

## 2024-02-26 DIAGNOSIS — R300 Dysuria: Secondary | ICD-10-CM

## 2024-02-26 DIAGNOSIS — M545 Low back pain, unspecified: Secondary | ICD-10-CM | POA: Diagnosis not present

## 2024-02-26 LAB — POCT URINALYSIS DIP (MANUAL ENTRY)
Bilirubin, UA: NEGATIVE
Blood, UA: NEGATIVE
Glucose, UA: NEGATIVE mg/dL
Ketones, POC UA: NEGATIVE mg/dL
Nitrite, UA: NEGATIVE
Spec Grav, UA: 1.025 (ref 1.010–1.025)
Urobilinogen, UA: 0.2 U/dL
pH, UA: 5.5 (ref 5.0–8.0)

## 2024-02-26 MED ORDER — CEPHALEXIN 500 MG PO CAPS
500.0000 mg | ORAL_CAPSULE | Freq: Three times a day (TID) | ORAL | 0 refills | Status: AC
Start: 2024-02-26 — End: 2024-03-02

## 2024-02-26 NOTE — Discharge Instructions (Addendum)
Take the antibiotic as directed.  The urine culture is pending.  We will call you if it shows the need to change or discontinue your antibiotic.    Follow up with your primary care provider tomorrow.  Go to the emergency department if you have worsening symptoms.

## 2024-02-26 NOTE — ED Triage Notes (Signed)
 Patient to Urgent Care with daughter, complaints of urinary frequency/ dysuria.  Symptoms started four days ago.

## 2024-02-26 NOTE — ED Provider Notes (Signed)
 Renaldo Fiddler    CSN: [id number] Arrival date & time: 02/26/24  1818      History   Chief Complaint Chief Complaint  Patient presents with   Urinary Frequency    HPI Whitney Glover is a 78 y.o. female.  Accompanied by her daughter, patient presents with dysuria, urinary frequency, right lower back pain x 4 days.  No falls or injury.  No fever, chills, abdominal pain, flank pain, hematuria, numbness, weakness.  She states her symptoms are similar to recent episode of UTI that was treated with Macrobid which was prescribed on an e-visit.  The history is provided by the patient, a relative and medical records.    Past Medical History:  Diagnosis Date   Arthritis    Asthma    Bronchitis    Dizziness    WHEN LAYS FLAT   Fatty liver    Hepatitis    AS A TEENAGER   Hypercalcemia    Hypercholesterolemia    Hyperlipidemia    Osteoporosis    Seasonal allergies    Shortness of breath dyspnea    WITH EXERTION    There are no active problems to display for this patient.   Past Surgical History:  Procedure Laterality Date   ABDOMINAL HYSTERECTOMY     36   CATARACT EXTRACTION W/PHACO Right 01/16/2016   Procedure: CATARACT EXTRACTION PHACO AND INTRAOCULAR LENS PLACEMENT (IOC);  Surgeon: Sallee Lange, MD;  Location: [facility] ORS;  Service: Ophthalmology;  Laterality: Right;  [place] 01:14 AP% 25.7 CDE 35.31 fluid pack lot # [id number]   CATARACT EXTRACTION W/PHACO Left 02/06/2016   Procedure: CATARACT EXTRACTION PHACO AND INTRAOCULAR LENS PLACEMENT (IOC);  Surgeon: Sallee Lange, MD;  Location: [facility] ORS;  Service: Ophthalmology;  Laterality: Left;  [place] 01:13 AP% 27.3 CDE 33.25 fluid opack lot # [id number]   CESAREAN SECTION     X3   COLONOSCOPY WITH PROPOFOL N/A 06/16/2018   Procedure: COLONOSCOPY WITH PROPOFOL;  Surgeon: Scot Jun, MD;  Location: [facility] ENDOSCOPY;  Service: Endoscopy;  Laterality: N/A;   DILATION AND CURETTAGE OF UTERUS     EYE SURGERY      OOPHORECTOMY Bilateral     OB History   No obstetric history on file.      Home Medications    Prior to Admission medications   Medication Sig Start Date End Date Taking? Authorizing Provider  cephALEXin (KEFLEX) 500 MG capsule Take 1 capsule (500 mg total) by mouth 3 (three) times daily for 5 days. 02/26/24 03/02/24 Yes Mickie Bail, NP  furosemide (LASIX) 20 MG tablet Take by mouth. 12/17/23  Yes [provider]  albuterol (PROVENTIL HFA;VENTOLIN HFA) 108 (90 Base) MCG/ACT inhaler Inhale into the lungs every 6 (six) hours as needed for wheezing or shortness of breath.    [provider]  aspirin 81 MG tablet Take 81 mg by mouth daily.    [provider]  atorvastatin (LIPITOR) 10 MG tablet Take 10 mg by mouth daily.    [provider]  atorvastatin (LIPITOR) 20 MG tablet Take 20 mg by mouth daily.    [provider]  cholecalciferol (VITAMIN D) 1000 units tablet Take 1,000 Units by mouth daily.    [provider]  polyethylene glycol (MIRALAX / GLYCOLAX) packet Take 17 g by mouth daily as needed. Patient not taking: Reported on 02/26/2024    [provider]  spironolactone (ALDACTONE) 25 MG tablet Take 25 mg by mouth daily.    [provider]    Family History Family History  Problem Relation Age of Onset   Breast cancer Paternal Aunt 85   Breast cancer Cousin    Breast cancer Other     Social History Social History   Tobacco Use   Smoking status: Former    Current packs/day: 0.00    Types: Cigarettes    Quit date: 02/17/2018    Years since quitting: 6.0   Smokeless tobacco: Never  Vaping Use   Vaping status: Never Used  Substance Use Topics   Alcohol use: No     Allergies   Erythromycin and Erythromycin base   Review of Systems Review of Systems  Constitutional:  Negative for chills and fever.  Gastrointestinal:  Negative for abdominal pain, nausea and vomiting.  Genitourinary:  Positive for  dysuria and frequency. Negative for flank pain and hematuria.  Musculoskeletal:  Positive for back pain. Negative for gait problem.     Physical Exam Triage Vital Signs ED Triage Vitals  Encounter Vitals Group     BP      Systolic BP Percentile      Diastolic BP Percentile      Pulse      Resp      Temp      Temp src      SpO2      Weight      Height      Head Circumference      Peak Flow      Pain Score      Pain Loc      Pain Education      Exclude from Growth Chart    No data found.  Updated Vital Signs BP 133/86   Pulse 64   Temp 97.8 F (36.6 C)   Resp 18   SpO2 97%   Visual Acuity Right Eye Distance:   Left Eye Distance:   Bilateral Distance:    Right Eye Near:   Left Eye Near:    Bilateral Near:     Physical Exam Constitutional:      General: She is not in acute distress. HENT:     Mouth/Throat:     Mouth: Mucous membranes are moist.  Cardiovascular:     Rate and Rhythm: Normal rate and regular rhythm.  Pulmonary:     Effort: Pulmonary effort is normal. No respiratory distress.  Abdominal:     General: Bowel sounds are normal.     Palpations: Abdomen is soft.     Tenderness: There is no abdominal tenderness. There is no right CVA tenderness, left CVA tenderness, guarding or rebound.  Musculoskeletal:        General: No deformity. Normal range of motion.  Skin:    General: Skin is warm and dry.     Findings: No bruising, erythema, lesion or rash.  Neurological:     General: No focal deficit present.     Mental Status: She is alert.     Sensory: No sensory deficit.     Motor: No weakness.     Gait: Gait normal.      UC Treatments / Results  Labs (all labs ordered are listed, but only abnormal results are displayed) Labs Reviewed  POCT URINALYSIS DIP (MANUAL ENTRY) - Abnormal; Notable for the following components:      Result Value   Protein Ur, POC trace (*)    Leukocytes, UA Small (1+) (*)    All other components within normal  limits  URINE CULTURE    EKG   Radiology No results found.  Procedures Procedures (including critical care time)  Medications Ordered in UC Medications - No data to display  Initial Impression / Assessment and Plan / UC Course  I have reviewed the triage vital signs and the nursing notes.  Pertinent labs & imaging results that were available during my care of the patient were reviewed by me and considered in my medical decision making (see chart for details).    Dysuria, low back pain.  Afebrile and vital signs are stable.  Treating with Keflex. Urine culture pending. Discussed with patient that we will call her if the urine culture shows the need to change or discontinue the antibiotic. Instructed her to follow-up with her PCP as her symptoms are recurrent. Patient agrees to plan of care.     Final Clinical Impressions(s) / UC Diagnoses   Final diagnoses:  Dysuria  Acute right-sided low back pain without sciatica     Discharge Instructions      Take the antibiotic as directed.  The urine culture is pending.  We will call you if it shows the need to change or discontinue your antibiotic.    Follow up with your primary care provider tomorrow.  Go to the emergency department if you have worsening symptoms.        ED Prescriptions     Medication Sig Dispense Auth. Provider   cephALEXin (KEFLEX) 500 MG capsule Take 1 capsule (500 mg total) by mouth 3 (three) times daily for 5 days. 15 capsule Mickie Bail, NP      PDMP not reviewed this encounter.   Mickie Bail, NP 02/26/24 [phone]

## 2024-02-27 LAB — URINE CULTURE

## 2024-04-10 ENCOUNTER — Telehealth: Payer: Self-pay

## 2024-04-10 NOTE — Telephone Encounter (Signed)
 Erroneous entry

## 2024-06-08 ENCOUNTER — Encounter: Payer: Self-pay | Admitting: Gastroenterology

## 2024-06-08 NOTE — Anesthesia Preprocedure Evaluation (Addendum)
 Anesthesia Evaluation  Patient identified by MRN, date of birth, ID band Patient awake    Reviewed: Allergy & Precautions, H&P , NPO status , Patient's Chart, lab work & pertinent test results  Airway Mallampati: IV  TM Distance: <3 FB Neck ROM: Full    Dental no notable dental hx. (+) Upper Dentures, Partial Lower   Pulmonary neg pulmonary ROS, shortness of breath, asthma , COPD, former smoker Smoking Status Former  Current packs/day: 0.00  Average packs/day: 0.8 packs/day for 45.0 years (33.8 ttl pk-yrs)  Types: Cigarettes  Start date: 02/17/1973  Quit date: 02/17/2018  Years since quitting: 5.5  PFTs: FEV1 - 78%  Office visit 02-26-24 reports progressive dyspnea. She is with mMRC 3. She continues to cough but denies acute exacerbation of COPD. She is fully compliant with inhaler therapy. She quit smoking 5 years ago. She feels mid fatigue but overall this is stable. When exerting herself she does not feel chest pain and is not requiring albuterol  rescue.  Auscultates clear, but patient wishes to take her inhaler, so she brought it with her and will take pre-procedure Pulmonary exam normal breath sounds clear to auscultation       Cardiovascular negative cardio ROS Normal cardiovascular exam Rhythm:Regular Rate:Normal     Neuro/Psych negative neurological ROS  negative psych ROS   GI/Hepatic negative GI ROS, Neg liver ROS,GERD  ,,(+) Hepatitis -  Endo/Other  negative endocrine ROS    Renal/GU Renal diseasenegative Renal ROS  negative genitourinary   Musculoskeletal negative musculoskeletal ROS (+) Arthritis ,    Abdominal   Peds negative pediatric ROS (+)  Hematology negative hematology ROS (+)   Anesthesia Other Findings  Shortness of breath  dyspnea  Hepatitis Dizziness  Bronchitis Hypercholesterolemia  Seasonal allergies Fatty  liver  Hypercalcemia Hyperlipidemia  Arthritis Osteoporosis  Asthma History of claustrophobia, can't handle masks over nose/face but plan nasal cannula today    Reproductive/Obstetrics negative OB ROS                              Anesthesia Physical Anesthesia Plan  ASA: 3  Anesthesia Plan: General   Post-op Pain Management:    Induction: Intravenous  PONV Risk Score and Plan:   Airway Management Planned: Natural Airway and Nasal Cannula  Additional Equipment:   Intra-op Plan:   Post-operative Plan:   Informed Consent: I have reviewed the patients History and Physical, chart, labs and discussed the procedure including the risks, benefits and alternatives for the proposed anesthesia with the patient or authorized representative who has indicated his/her understanding and acceptance.     Dental Advisory Given  Plan Discussed with: Anesthesiologist, CRNA and Surgeon  Anesthesia Plan Comments: (Patient consented for risks of anesthesia including but not limited to:  - adverse reactions to medications - risk of airway placement if required - damage to eyes, teeth, lips or other oral mucosa - nerve damage due to positioning  - sore throat or hoarseness - Damage to heart, brain, nerves, lungs, other parts of body or loss of life  Patient voiced understanding and assent.)         Anesthesia Quick Evaluation

## 2024-06-15 ENCOUNTER — Ambulatory Visit: Payer: Self-pay | Admitting: Anesthesiology

## 2024-06-15 ENCOUNTER — Encounter
Admission: RE | Disposition: A | Discharge status: Home / Self Care | Payer: Self-pay | Source: Home / Self Care | Attending: Gastroenterology

## 2024-06-15 ENCOUNTER — Encounter: Payer: Self-pay | Admitting: Gastroenterology

## 2024-06-15 ENCOUNTER — Ambulatory Visit
Admission: RE | Admit: 2024-06-15 | Discharge: 2024-06-15 | Disposition: A | Discharge status: Home / Self Care | Attending: Gastroenterology | Admitting: Gastroenterology

## 2024-06-15 ENCOUNTER — Other Ambulatory Visit: Payer: Self-pay

## 2024-06-15 DIAGNOSIS — Z7951 Long term (current) use of inhaled steroids: Secondary | ICD-10-CM | POA: Insufficient documentation

## 2024-06-15 DIAGNOSIS — Z1211 Encounter for screening for malignant neoplasm of colon: Secondary | ICD-10-CM | POA: Diagnosis not present

## 2024-06-15 DIAGNOSIS — Z87891 Personal history of nicotine dependence: Secondary | ICD-10-CM | POA: Diagnosis not present

## 2024-06-15 DIAGNOSIS — J449 Chronic obstructive pulmonary disease, unspecified: Secondary | ICD-10-CM | POA: Diagnosis not present

## 2024-06-15 DIAGNOSIS — Z860101 Personal history of adenomatous and serrated colon polyps: Secondary | ICD-10-CM | POA: Diagnosis present

## 2024-06-15 DIAGNOSIS — N1831 Chronic kidney disease, stage 3a: Secondary | ICD-10-CM | POA: Diagnosis not present

## 2024-06-15 DIAGNOSIS — M1990 Unspecified osteoarthritis, unspecified site: Secondary | ICD-10-CM | POA: Insufficient documentation

## 2024-06-15 DIAGNOSIS — Z7982 Long term (current) use of aspirin: Secondary | ICD-10-CM | POA: Diagnosis not present

## 2024-06-15 DIAGNOSIS — K5730 Diverticulosis of large intestine without perforation or abscess without bleeding: Secondary | ICD-10-CM | POA: Insufficient documentation

## 2024-06-15 DIAGNOSIS — K219 Gastro-esophageal reflux disease without esophagitis: Secondary | ICD-10-CM | POA: Insufficient documentation

## 2024-06-15 DIAGNOSIS — K644 Residual hemorrhoidal skin tags: Secondary | ICD-10-CM | POA: Insufficient documentation

## 2024-06-15 DIAGNOSIS — Z79899 Other long term (current) drug therapy: Secondary | ICD-10-CM | POA: Insufficient documentation

## 2024-06-15 HISTORY — DX: Moderate persistent asthma, uncomplicated: J45.40

## 2024-06-15 HISTORY — DX: Allergic rhinitis due to pollen: J30.1

## 2024-06-15 HISTORY — DX: Chronic obstructive pulmonary disease, unspecified: J44.9

## 2024-06-15 HISTORY — PX: COLONOSCOPY: SHX5424

## 2024-06-15 HISTORY — DX: Chronic kidney disease, stage 3a: N18.31

## 2024-06-15 HISTORY — DX: Atherosclerosis of aorta: I70.0

## 2024-06-15 HISTORY — DX: Other specified postprocedural states: Z98.890

## 2024-06-15 HISTORY — DX: Gastro-esophageal reflux disease without esophagitis: K21.9

## 2024-06-15 HISTORY — DX: Personal history of other mental and behavioral disorders: Z86.59

## 2024-06-15 SURGERY — COLONOSCOPY
Anesthesia: General | Site: Rectum

## 2024-06-15 MED ORDER — PROPOFOL 10 MG/ML IV BOLUS
INTRAVENOUS | Status: DC | PRN
  Administered 2024-06-15 (×4): 40 mg via INTRAVENOUS
  Administered 2024-06-15: 80 mg via INTRAVENOUS

## 2024-06-15 MED ORDER — STERILE WATER FOR IRRIGATION IR SOLN: Status: DC | PRN

## 2024-06-15 MED ORDER — PROPOFOL 10 MG/ML IV BOLUS
INTRAVENOUS | Status: AC
  Filled 2024-06-15: qty 20

## 2024-06-15 MED ORDER — SEVOFLURANE IN SOLN
RESPIRATORY_TRACT | Status: AC
  Filled 2024-06-15: qty 250

## 2024-06-15 MED ORDER — LIDOCAINE HCL (CARDIAC) PF 100 MG/5ML IV SOSY
PREFILLED_SYRINGE | INTRAVENOUS | Status: DC | PRN
  Administered 2024-06-15: 50 mg via INTRAVENOUS

## 2024-06-15 MED ORDER — STERILE WATER FOR IRRIGATION IR SOLN
Status: DC | PRN
Start: 2024-06-15 — End: 2024-06-15
  Administered 2024-06-15: 250 mL

## 2024-06-15 MED ORDER — LIDOCAINE HCL (PF) 2 % IJ SOLN
INTRAMUSCULAR | Status: AC
  Filled 2024-06-15: qty 5

## 2024-06-15 MED ORDER — LACTATED RINGERS IV SOLN: INTRAVENOUS | Status: DC

## 2024-06-15 MED ORDER — SODIUM CHLORIDE 0.9 % IV SOLN: INTRAVENOUS | Status: DC

## 2024-06-15 SURGICAL SUPPLY — 16 items
CLIP HMST 235XBRD CATH ROT (MISCELLANEOUS) IMPLANT
ELECTRODE REM PT RTRN 9FT ADLT (ELECTROSURGICAL) IMPLANT
FORCEPS BIOP RAD 4 LRG CAP 4 (CUTTING FORCEPS) IMPLANT
FORCEPS ESCP3.2XJMB 240X2.8X (MISCELLANEOUS) IMPLANT
GAUZE SPONGE 4X4 12PLY STRL (GAUZE/BANDAGES/DRESSINGS) IMPLANT
GOWN CVR UNV OPN BCK APRN NK (MISCELLANEOUS) ×2 IMPLANT
INJECTOR VARIJECT VIN23 (MISCELLANEOUS) IMPLANT
KIT DEFENDO VALVE AND CONN (KITS) IMPLANT
KIT PRC NS LF DISP ENDO (KITS) ×1 IMPLANT
MANIFOLD NEPTUNE II (INSTRUMENTS) ×1 IMPLANT
MARKER SPOT ENDO TATTOO 5ML (MISCELLANEOUS) IMPLANT
PROBE APC STR FIRE (PROBE) IMPLANT
RETRIEVER NET ROTH 2.5X230 LF (MISCELLANEOUS) IMPLANT
SNARE COLD EXACTO (MISCELLANEOUS) IMPLANT
TRAP ETRAP POLY (MISCELLANEOUS) IMPLANT
WATER STERILE IRR 250ML POUR (IV SOLUTION) ×1 IMPLANT

## 2024-06-15 NOTE — H&P (Signed)
 Whitney JONELLE Brooklyn, MD [facility], [facility] [address]  [place], [place] [address]  Main: [phone] Fax:  [phone] Pager: [phone]   Primary Care Physician:  [facility] Primary Gastroenterologist:  Dr. Corinn JONELLE Glover  Pre-Procedure History & Physical: HPI:  Whitney Glover is a 78 y.o. female is here for an colonoscopy.   Past Medical History:  Diagnosis Date   Advanced chronic obstructive pulmonary disease (HCC)    Allergic rhinitis due to pollen, unspecified seasonality    Aortic atherosclerosis (HCC)    Arthritis    Asthma    Bronchitis    COPD (chronic obstructive pulmonary disease) (HCC)    Dizziness    WHEN LAYS FLAT   Fatty liver    GERD (gastroesophageal reflux disease)    Hepatitis    AS A TEENAGER   History of claustrophobia    Hypercalcemia    Hypercholesterolemia    Hyperlipidemia    Moderate persistent asthma    Osteoporosis    S/P parathyroidectomy    Seasonal allergies    Shortness of breath dyspnea    WITH EXERTION   Stage 3a chronic kidney disease (CKD) (HCC)     Past Surgical History:  Procedure Laterality Date   ABDOMINAL HYSTERECTOMY     36   CATARACT EXTRACTION W/PHACO Right 01/16/2016   Procedure: CATARACT EXTRACTION PHACO AND INTRAOCULAR LENS PLACEMENT (IOC);  Surgeon: Steven Dingeldein, MD;  Location: [facility] ORS;  Service: Ophthalmology;  Laterality: Right;  [place]  01:14 AP% 25.7 CDE 35.31 fluid pack lot # [id number]   CATARACT EXTRACTION W/PHACO Left 02/06/2016   Procedure: CATARACT EXTRACTION PHACO AND INTRAOCULAR LENS PLACEMENT (IOC);  Surgeon: Steven Dingeldein, MD;  Location: [facility] ORS;  Service: Ophthalmology;  Laterality: Left;  [place]  01:13 AP% 27.3 CDE 33.25 fluid opack lot # [id number]   CESAREAN SECTION     X3   COLONOSCOPY WITH PROPOFOL  N/A 06/16/2018   Procedure: COLONOSCOPY WITH PROPOFOL ;  Surgeon: Viktoria Lamar DASEN, MD;  Location[facility] ENDOSCOPY;  Service: Endoscopy;  Laterality:  N/A;   DILATION AND CURETTAGE OF UTERUS     EYE SURGERY     OOPHORECTOMY Bilateral     Prior to Admission medications   Medication Sig Start Date End Date Taking? Authorizing Provider  acetaminophen (TYLENOL) 325 MG tablet Take 650 mg by mouth every 6 (six) hours as needed.   Yes [provider]  albuterol  (PROVENTIL  HFA;VENTOLIN  HFA) 108 (90 Base) MCG/ACT inhaler Inhale into the lungs every 6 (six) hours as needed for wheezing or shortness of breath.   Yes [provider]  aspirin 81 MG tablet Take 81 mg by mouth daily.   Yes [provider]  atorvastatin (LIPITOR) 20 MG tablet Take 20 mg by mouth daily.   Yes [provider]  cholecalciferol (VITAMIN D) 1000 units tablet Take 1,000 Units by mouth daily.   Yes [provider]  furosemide (LASIX) 20 MG tablet Take by mouth. 12/17/23  Yes [provider]  spironolactone (ALDACTONE) 25 MG tablet Take 25 mg by mouth daily.   Yes [provider]  polyethylene glycol (MIRALAX / GLYCOLAX) packet Take 17 g by mouth daily as needed. Patient not taking: Reported on 02/26/2024    [provider]    Allergies as of 06/02/2024 - Review Complete 02/26/2024  Allergen Reaction Noted   Erythromycin Nausea And Vomiting 04/07/2015   Erythromycin base Other (See Comments) 02/03/2016    Family History  Problem Relation Age  of Onset   Breast cancer Paternal Aunt 73   Breast cancer Cousin    Breast cancer Other     Social History   Socioeconomic History   Marital status: Married    Spouse name: Not on file   Number of children: Not on file   Years of education: Not on file   Highest education level: Not on file  Occupational History   Not on file  Tobacco Use   Smoking status: Former    Current packs/day: 0.00    Types: Cigarettes    Quit date: 02/17/2018    Years since quitting: 6.3   Smokeless tobacco: Never  Vaping Use   Vaping status: Never Used  Substance and Sexual  Activity   Alcohol use: No   Drug use: Not on file   Sexual activity: Not on file  Other Topics Concern   Not on file  Social History Narrative   Not on file   Social Drivers of Health   Financial Resource Strain: Low Risk  (09/23/2023)   Received from [facility]   Overall Financial Resource Strain (CARDIA)    Difficulty of Paying Living Expenses: Not hard at all  Food Insecurity: No Food Insecurity (09/23/2023)   Received from [facility]   Hunger Vital Sign    Within the past 12 months, you worried that your food would run out before you got the money to buy more.: Never true    Within the past 12 months, the food you bought just didn't last and you didn't have money to get more.: Never true  Transportation Needs: No Transportation Needs (09/23/2023)   Received from [facility] - Transportation    In the past 12 months, has lack of transportation kept you from medical appointments or from getting medications?: No    Lack of Transportation (Non-Medical): No  Physical Activity: Not on file  Stress: Not on file  Social Connections: Not on file  Intimate Partner Violence: Not on file    Review of Systems: See HPI, otherwise negative ROS  Physical Exam: BP (!) 142/66   Pulse 64   Temp 98 F (36.7 C) (Temporal)   Resp 18   Ht 5' (1.524 m)   Wt 77.1 kg   SpO2 97%   BMI 33.20 kg/m  General:   Alert,  pleasant and cooperative in NAD Head:  Normocephalic and atraumatic. Neck:  Supple; no masses or thyromegaly. Lungs:  Clear throughout to auscultation.    Heart:  Regular rate and rhythm. Abdomen:  Soft, nontender and nondistended. Normal bowel sounds, without guarding, and without rebound.   Neurologic:  Alert and  oriented x4;  grossly normal neurologically.  Impression/Plan: Whitney Glover is here for an colonoscopy to be performed for h/o colon adenoma  Risks, benefits, limitations, and alternatives  regarding  colonoscopy have been reviewed with the patient.  Questions have been answered.  All parties agreeable.   Whitney Brooklyn, MD  06/15/2024, 8:51 AM

## 2024-06-15 NOTE — Transfer of Care (Signed)
 Immediate Anesthesia Transfer of Care Note  Patient: Whitney Glover  Procedure(s) Performed: COLONOSCOPY (Rectum)  Patient Location: PACU  Anesthesia Type: General  Level of Consciousness: awake, alert  and patient cooperative  Airway and Oxygen Therapy: Patient Spontanous Breathing and Patient connected to supplemental oxygen  Post-op Assessment: Post-op Vital signs reviewed, Patient's Cardiovascular Status Stable, Respiratory Function Stable, Patent Airway and No signs of Nausea or vomiting  Post-op Vital Signs: Reviewed and stable  Complications: No notable events documented.

## 2024-06-15 NOTE — Anesthesia Postprocedure Evaluation (Signed)
 Anesthesia Post Note  Patient: Whitney Glover  Procedure(s) Performed: COLONOSCOPY (Rectum)  Patient location during evaluation: PACU Anesthesia Type: General Level of consciousness: awake and alert Pain management: pain level controlled Vital Signs Assessment: post-procedure vital signs reviewed and stable Respiratory status: spontaneous breathing, nonlabored ventilation, respiratory function stable and patient connected to nasal cannula oxygen Cardiovascular status: blood pressure returned to baseline and stable Postop Assessment: no apparent nausea or vomiting Anesthetic complications: no   No notable events documented.   Last Vitals:  Vitals:   06/15/24 0923 06/15/24 0932  BP: (!) 117/55 (!) 115/52  Pulse: 73 67  Resp: 14 17  Temp: 36.4 C 36.4 C  SpO2: 98% 99%    Last Pain:  Vitals:   06/15/24 0932  TempSrc:   PainSc: 0-No pain                 Tavaria Mackins C [place]ah Criswell

## 2024-06-15 NOTE — Op Note (Signed)
 [facility] Patient Name: Whitney Glover Procedure Date: 06/15/2024 8:54 AM MRN: [id number] Account #: [id number] Date of Birth: February 07, 1946 Admit Type: Outpatient Age: 78 Room: [facility] OR ROOM 01 Gender: Female Note Status: Finalized Instrument Name: [id number] Procedure:             Colonoscopy Indications:           Surveillance: Personal history of adenomatous polyps                         on last colonoscopy > 5 years ago, Last colonoscopy:                         July 2019 Providers:             Corinn Jess Brooklyn MD, MD Referring MD:          [facility] (Referring MD) Medicines:             General Anesthesia Complications:         No immediate complications. Estimated blood loss: None. Procedure:             Pre-Anesthesia Assessment:                        - Prior to the procedure, a History and Physical was                         performed, and patient medications and allergies were                         reviewed. The patient is competent. The risks and                         benefits of the procedure and the sedation options and                         risks were discussed with the patient. All questions                         were answered and informed consent was obtained.                         Patient identification and proposed procedure were                         verified by the physician, the nurse, the                         anesthesiologist, the anesthetist and the technician                         in the pre-procedure area in the procedure room in the                         endoscopy suite. Mental Status Examination: alert and                         oriented. Airway Examination: normal oropharyngeal  airway and neck mobility. Respiratory Examination:                         clear to auscultation. CV Examination: normal.                         Prophylactic Antibiotics: The patient  does not require                         prophylactic antibiotics. Prior Anticoagulants: The                         patient has taken no anticoagulant or antiplatelet                         agents. ASA Grade Assessment: III - A patient with                         severe systemic disease. After reviewing the risks and                         benefits, the patient was deemed in satisfactory                         condition to undergo the procedure. The anesthesia                         plan was to use general anesthesia. Immediately prior                         to administration of medications, the patient was                         re-assessed for adequacy to receive sedatives. The                         heart rate, respiratory rate, oxygen saturations,                         blood pressure, adequacy of pulmonary ventilation, and                         response to care were monitored throughout the                         procedure. The physical status of the patient was                         re-assessed after the procedure.                        After obtaining informed consent, the colonoscope was                         passed under direct vision. Throughout the procedure,                         the patient's blood pressure, pulse, and oxygen  saturations were monitored continuously. The                         Colonoscope was introduced through the anus and                         advanced to the the cecum, identified by appendiceal                         orifice and ileocecal valve. The colonoscopy was                         unusually difficult due to multiple diverticula in the                         colon. Successful completion of the procedure was                         aided by withdrawing the scope and replacing with the                         pediatric colonoscope and applying abdominal pressure.                         The patient  tolerated the procedure well. The quality                         of the bowel preparation was evaluated using the BBPS                         [facility] Bowel Preparation Scale) with scores of: Right                         Colon = 3, Transverse Colon = 3 and Left Colon = 3                         (entire mucosa seen well with no residual staining,                         small fragments of stool or opaque liquid). The total                         BBPS score equals 9. The ileocecal valve, appendiceal                         orifice, and rectum were photographed. Findings:      Skin tags were found on perianal exam.      Multiple large-mouthed diverticula were found in the recto-sigmoid colon       and sigmoid colon. There was no evidence of diverticular bleeding.      Non-bleeding hemorrhoids were found during retroflexion. The hemorrhoids       were small. Impression:            - Perianal skin tags found on perianal exam.                        - Severe diverticulosis in the recto-sigmoid colon and  in the sigmoid colon. There was no evidence of                         diverticular bleeding.                        - Non-bleeding hemorrhoids.                        - No specimens collected. Recommendation:        - Discharge patient to home (with escort).                        - Resume previous diet today.                        - Continue present medications. Procedure Code(s):     --- Professional ---                        H9894, Colorectal cancer screening; colonoscopy on                         individual at high risk Diagnosis Code(s):     --- Professional ---                        Z86.010, Personal history of colonic polyps                        K64.9, Unspecified hemorrhoids                        K64.4, Residual hemorrhoidal skin tags                        K57.30, Diverticulosis of large intestine without                         perforation or  abscess without bleeding CPT copyright 2022 American Medical Association. All rights reserved. The codes documented in this report are preliminary and upon coder review may  be revised to meet current compliance requirements. Dr. Corinn Brooklyn Corinn Jess Brooklyn MD, MD 06/15/2024 9:23:37 AM This report has been signed electronically. Number of Addenda: 0 Note Initiated On: 06/15/2024 8:54 AM Scope Withdrawal Time: 0 hours 8 minutes 40 seconds  Total Procedure Duration: 0 hours 19 minutes 25 seconds  Estimated Blood Loss:  Estimated blood loss: none.      [facility]

## 2024-07-01 ENCOUNTER — Other Ambulatory Visit: Payer: Self-pay | Admitting: Pulmonary Disease

## 2024-07-01 DIAGNOSIS — Z87891 Personal history of nicotine dependence: Secondary | ICD-10-CM

## 2024-07-01 DIAGNOSIS — J449 Chronic obstructive pulmonary disease, unspecified: Secondary | ICD-10-CM

## 2024-07-27 ENCOUNTER — Ambulatory Visit
Admission: RE | Admit: 2024-07-27 | Discharge: 2024-07-27 | Disposition: A | Discharge status: Home / Self Care | Source: Ambulatory Visit | Attending: Pulmonary Disease | Admitting: Pulmonary Disease

## 2024-07-27 DIAGNOSIS — J449 Chronic obstructive pulmonary disease, unspecified: Secondary | ICD-10-CM | POA: Insufficient documentation

## 2024-07-27 DIAGNOSIS — Z87891 Personal history of nicotine dependence: Secondary | ICD-10-CM | POA: Insufficient documentation

## 2024-07-30 NOTE — Progress Notes (Signed)
 CRITICAL VALUE STICKER  CRITICAL VALUE:  Plt [id number]  RECEIVER (on-site recipient of call):  Woodie Kapur RN  DATE & TIME NOTIFIED:   07/30/2024 @ 1352  MESSENGER (representative from lab):  [facility]  MD NOTIFIED:   Dr. Ezzard   TIME OF NOTIFICATION:  1404  RESPONSE:  Aware

## 2024-08-21 ENCOUNTER — Other Ambulatory Visit: Payer: Self-pay | Admitting: Internal Medicine

## 2024-08-21 DIAGNOSIS — R9389 Abnormal findings on diagnostic imaging of other specified body structures: Secondary | ICD-10-CM

## 2024-08-21 DIAGNOSIS — R0602 Shortness of breath: Secondary | ICD-10-CM

## 2024-08-24 NOTE — Progress Notes (Addendum)
 New Patient Visit    Chief Complaint: Chief Complaint  Patient presents with  . New Patient    Referred by Pulmonary for Abnormal chest CT   Date of Service: 08/21/2024 Date of Birth: 10-04-1946 PCP: Jyl Heron Haff, MD  History of Present Illness: Ms. Mandrell is a 78 y.o.female patient who patient has a history of COPD smoker screening CT was done which showed significant calcification of his coronary anatomy patient is referred to cardiology because of some chest pain shortness of breath symptoms recurrent angina.  Patient has a history of smoking COPD obesity renal insufficiency hyperlipidemia lower extremity edema asthma patient lives alone starting to do some exercise she walks in her driveway took care of her father before he died at 43 now here for follow-up evaluation  Past Medical and Surgical History  Past Medical History Past Medical History:  Diagnosis Date  . Aortic atherosclerosis    on CT scan 2018  . COVID-19 11/2019  . Fatty liver 04/06/2018  . Hyperlipidemia   . Hyperparathyroidism s/p parathyroidectomy 09/28/2022   . Multiple thyroid nodules   . Osteoporosis, post-menopausal   . Prediabetes    A1c 5.9% in 01/2020  . Stage 3a chronic kidney disease (CMS-HCC) 05/18/2022  . Tobacco dependence    quit 02/17/2018    Past Surgical History She has a past surgical history that includes Cesarean section; eye surgery age 86; Hysterectomy; Cataract extraction; Colonoscopy (08/19/2007); Colonoscopy (06/16/2018); parathyroidectomy (09/28/2022); and COLON@[facility]  (06/15/2024).   Medications and Allergies  Current Medications Current Outpatient Medications  Medication Sig Dispense Refill  . acetaminophen (TYLENOL) 325 MG tablet Take 325 mg by mouth every 6 (six) hours as needed Takes every morning and as needed    . albuterol  MDI, PROVENTIL , VENTOLIN , PROAIR , HFA 90 mcg/actuation inhaler Inhale 2 inhalations into the lungs every 6 (six) hours as needed 54 g 2  .  aspirin 81 mg tablet Take 81 mg by mouth once daily    . atorvastatin (LIPITOR) 20 MG tablet Take 1 tablet (20 mg total) by mouth once daily 100 tablet 3  . FUROsemide (LASIX) 20 MG tablet Take 1 tablet (20 mg total) by mouth once daily 90 tablet 0  . magnesium hydroxide (PHILLIPS MILK OF MAGNESIA ORAL) Take by mouth once daily as needed    . spironolactone (ALDACTONE) 25 MG tablet Take 1 tablet by mouth once daily 100 tablet 3  . VITAMIN D3 ORAL Take 2,000 Units by mouth 52mcg/2000IU     No current facility-administered medications for this visit.    Allergies Erythromycin  Social and Family History  Social History  reports that she quit smoking about 6 years ago. Her smoking use included cigarettes. She started smoking about 51 years ago. She has a 33.8 pack-year smoking history. She has been exposed to tobacco smoke. She has never used smokeless tobacco. She reports that she does not drink alcohol and does not use drugs.  Family History family history includes Alzheimer's disease in her father; Breast cancer in her daughter; Cancer in her paternal aunt; Diabetes type II in her mother; High blood pressure (Hypertension) in her father; Lung cancer in her mother; Prostate cancer in her brother and father; Stroke in her sister; Ulcers in her father.   Review of Systems   Review of Systems: The patient denies chest pain, shortness of breath, orthopnea, paroxysmal nocturnal dyspnea, pedal edema, palpitations, heart racing, presyncope, syncope. Review of 12 Systems is negative except as described above.  Physical Examination  Vitals:BP 124/70   Pulse 62   Ht 152.4 cm (5')   Wt 77.5 kg (170 lb 12.8 oz)   SpO2 99%   BMI 33.36 kg/m  Ht:152.4 cm (5') Wt:77.5 kg (170 lb 12.8 oz) ADJ:Anib surface area is 1.81 meters squared. Body mass index is 33.36 kg/m.  HEENT: Pupils equally reactive to light and accomodation  Neck: Supple without thyromegaly, carotid pulses 2+ Lungs: clear to  auscultation bilaterally; no wheezes, rales, rhonchi Heart: Regular rate and rhythm.  No gallops, murmurs or rub Abdomen: soft nontender, nondistended, with normal bowel sounds Extremities: no cyanosis, clubbing, or edema Peripheral Pulses: 2+ in all extremities, 2+ femoral pulses bilaterally Neurologic: Alert and oriented X3; speech intact; face symmetrical; moves all extremities well  Assessment   78 y.o. female with  1. Abnormal CT scan   2. SOB (shortness of breath)   3. Chronic edema   4. Chronic obstructive pulmonary disease, unspecified COPD type (CMS/HHS-HCC)   5. Cigarette smoker   6. Mixed hyperlipidemia   7. Edema of both legs   8. Stage 3b chronic kidney disease (CMS-HCC)   9. Former smoker   10. High coronary artery calcium score        Plan  Elevated coronary calcium recommend cardiac CTA with FFR for evaluation anginal symptoms arteriosclerotic coronary disease Smoking advised patient refrain from tobacco abuse Obesity recommend routine weight loss exercise portion control Hyperlipidemia continue statin therapy for hyperlipidemia currently but goal is less than 100 COPD continue inhalers follow-up with pulmonary advised patient refrain from tobacco abuse Lower extremity edema avoid stockings elevation spironolactone Lasix Possible obstructive sleep apnea recommend sleep study for further evaluation CPAP if indicated Chronic renal sufficiency stage III recommend follow-up and management as per nephrology Recommend formal cardiac CTA with FFR because of elevated coronary calcium Lexiscan Myoview for evaluation of ischemia and anginal Patient has shortness of breath recommend echocardiogram for evaluation of valvular structures left ventricular function Have the patient follow-up in 2 months      Return in about 2 months (around 10/21/2024).  DWAYNE D CALLWOOD, MD  This dictation was prepared with dragon dictation.  Any transcription errors that result from  this process are unintentional.

## 2024-09-05 ENCOUNTER — Ambulatory Visit
Admission: EM | Admit: 2024-09-05 | Discharge: 2024-09-05 | Disposition: A | Discharge status: Home / Self Care | Attending: Emergency Medicine | Admitting: Emergency Medicine

## 2024-09-05 DIAGNOSIS — R350 Frequency of micturition: Secondary | ICD-10-CM | POA: Diagnosis not present

## 2024-09-05 DIAGNOSIS — R82998 Other abnormal findings in urine: Secondary | ICD-10-CM

## 2024-09-05 LAB — POCT URINE DIPSTICK
Bilirubin, UA: NEGATIVE
Blood, UA: NEGATIVE
Glucose, UA: NEGATIVE mg/dL
Ketones, POC UA: NEGATIVE mg/dL
Nitrite, UA: NEGATIVE
POC PROTEIN,UA: NEGATIVE
Spec Grav, UA: 1.015 (ref 1.010–1.025)
Urobilinogen, UA: 1 U/dL
pH, UA: 5.5 (ref 5.0–8.0)

## 2024-09-05 MED ORDER — CEPHALEXIN 500 MG PO CAPS: 500.0000 mg | ORAL_CAPSULE | Freq: Two times a day (BID) | ORAL | 0 refills | Status: AC

## 2024-09-05 NOTE — ED Provider Notes (Signed)
 [facility]    CSN: [id number] Arrival date & time: 09/05/24  1215      History   Chief Complaint Chief Complaint  Patient presents with   Urinary Frequency    HPI Whitney Glover is a 78 y.o. female.  Patient presents with 6-day history of urinary frequency, dark urine, low back pain.  She took a UTI test at home which she states was positive.  She denies fever, chills, abdominal pain, dysuria, hematuria, flank pain.  Patient is concern for UTI because she has a cardiac test on Monday and does not want anything to interfere with her test.  The history is provided by the patient and medical records.    Past Medical History:  Diagnosis Date   Advanced chronic obstructive pulmonary disease (HCC)    Allergic rhinitis due to pollen, unspecified seasonality    Aortic atherosclerosis    Arthritis    Asthma    Bronchitis    COPD (chronic obstructive pulmonary disease) (HCC)    Dizziness    WHEN LAYS FLAT   Fatty liver    GERD (gastroesophageal reflux disease)    Hepatitis    AS A TEENAGER   History of claustrophobia    Hypercalcemia    Hypercholesterolemia    Hyperlipidemia    Moderate persistent asthma    Osteoporosis    S/P parathyroidectomy    Seasonal allergies    Shortness of breath dyspnea    WITH EXERTION   Stage 3a chronic kidney disease (CKD) (HCC)     There are no active problems to display for this patient.   Past Surgical History:  Procedure Laterality Date   ABDOMINAL HYSTERECTOMY     36   CATARACT EXTRACTION W/PHACO Right 01/16/2016   Procedure: CATARACT EXTRACTION PHACO AND INTRAOCULAR LENS PLACEMENT (IOC);  Surgeon: Steven Dingeldein, MD;  Location: [facility] ORS;  Service: Ophthalmology;  Laterality: Right;  US  01:14 AP% 25.7 CDE 35.31 fluid pack lot # [id number]   CATARACT EXTRACTION W/PHACO Left 02/06/2016   Procedure: CATARACT EXTRACTION PHACO AND INTRAOCULAR LENS PLACEMENT (IOC);  Surgeon: Steven Dingeldein, MD;  Location: [facility] ORS;   Service: Ophthalmology;  Laterality: Left;  US  01:13 AP% 27.3 CDE 33.25 fluid opack lot # [id number]   CESAREAN SECTION     X3   COLONOSCOPY N/A 06/15/2024   Procedure: COLONOSCOPY;  Surgeon: Unk Corinn Skiff, MD;  Location: [facility];  Service: Endoscopy;  Laterality: N/A;   COLONOSCOPY WITH PROPOFOL  N/A 06/16/2018   Procedure: COLONOSCOPY WITH PROPOFOL ;  Surgeon: Viktoria Lamar DASEN, MD;  Location: [facility];  Service: Endoscopy;  Laterality: N/A;   DILATION AND CURETTAGE OF UTERUS     EYE SURGERY     OOPHORECTOMY Bilateral     OB History   No obstetric history on file.      Home Medications    Prior to Admission medications   Medication Sig Start Date End Date Taking? Authorizing Provider  cephALEXin  (KEFLEX ) 500 MG capsule Take 1 capsule (500 mg total) by mouth 2 (two) times daily for 5 days. 09/05/24 09/10/24 Yes Corlis Burnard DEL, NP  acetaminophen (TYLENOL) 325 MG tablet Take 650 mg by mouth every 6 (six) hours as needed.    [provider]  albuterol  (PROVENTIL  HFA;VENTOLIN  HFA) 108 (90 Base) MCG/ACT inhaler Inhale into the lungs every 6 (six) hours as needed for wheezing or shortness of breath.    [provider]  aspirin 81 MG tablet Take 81 mg by mouth daily.  [provider]  atorvastatin (LIPITOR) 20 MG tablet Take 20 mg by mouth daily.    [provider]  cholecalciferol (VITAMIN D) 1000 units tablet Take 1,000 Units by mouth daily.    [provider]  furosemide (LASIX) 20 MG tablet Take by mouth. 12/17/23   [provider]  polyethylene glycol (MIRALAX / GLYCOLAX) packet Take 17 g by mouth daily as needed. Patient not taking: Reported on 02/26/2024    [provider]  spironolactone (ALDACTONE) 25 MG tablet Take 25 mg by mouth daily.    [provider]    Family History Family History  Problem Relation Age of Onset   Breast cancer Paternal Aunt 46   Breast cancer Cousin    Breast  cancer Other     Social History Social History   Tobacco Use   Smoking status: Former    Current packs/day: 0.00    Types: Cigarettes    Quit date: 02/17/2018    Years since quitting: 6.5   Smokeless tobacco: Never  Vaping Use   Vaping status: Never Used  Substance Use Topics   Alcohol use: No     Allergies   Erythromycin and Erythromycin base   Review of Systems Review of Systems  Constitutional:  Negative for chills and fever.  Gastrointestinal:  Negative for abdominal pain.  Genitourinary:  Positive for frequency. Negative for dysuria, flank pain and hematuria.  Musculoskeletal:  Positive for back pain. Negative for gait problem.     Physical Exam Triage Vital Signs ED Triage Vitals [09/05/24 1254]  Encounter Vitals Group     BP 139/82     Girls Systolic BP Percentile      Girls Diastolic BP Percentile      Boys Systolic BP Percentile      Boys Diastolic BP Percentile      Pulse Rate 77     Resp 18     Temp 97.8 F (36.6 C)     Temp src      SpO2 96 %     Weight      Height      Head Circumference      Peak Flow      Pain Score      Pain Loc      Pain Education      Exclude from Growth Chart    No data found.  Updated Vital Signs BP 139/82   Pulse 77   Temp 97.8 F (36.6 C)   Resp 18   SpO2 96%   Visual Acuity Right Eye Distance:   Left Eye Distance:   Bilateral Distance:    Right Eye Near:   Left Eye Near:    Bilateral Near:     Physical Exam Constitutional:      General: She is not in acute distress. HENT:     Mouth/Throat:     Mouth: Mucous membranes are moist.  Cardiovascular:     Rate and Rhythm: Normal rate.  Pulmonary:     Effort: Pulmonary effort is normal. No respiratory distress.  Abdominal:     General: Bowel sounds are normal.     Palpations: Abdomen is soft.     Tenderness: There is no abdominal tenderness. There is no right CVA tenderness, left CVA tenderness, guarding or rebound.  Neurological:     Mental  Status: She is alert.      UC Treatments / Results  Labs (all labs ordered are listed, but only abnormal results  are displayed) Labs Reviewed  POCT URINE DIPSTICK - Abnormal; Notable for the following components:      Result Value   Leukocytes, UA Trace (*)    All other components within normal limits  URINE CULTURE    EKG   Radiology No results found.  Procedures Procedures (including critical care time)  Medications Ordered in UC Medications - No data to display  Initial Impression / Assessment and Plan / UC Course  I have reviewed the triage vital signs and the nursing notes.  Pertinent labs & imaging results that were available during my care of the patient were reviewed by me and considered in my medical decision making (see chart for details).    Urinary frequency, leukocytes in urine.  Afebrile and vital signs are stable.Treating with Keflex . Urine culture pending. Discussed with patient that we will call her if the urine culture shows the need to change or discontinue the antibiotic. Instructed her to follow-up with her PCP if her symptoms are not improving. Patient agrees to plan of care.     Final Clinical Impressions(s) / UC Diagnoses   Final diagnoses:  Urinary frequency  Leukocytes in urine     Discharge Instructions      Take the antibiotic as directed.  The urine culture is pending.  We will call you if it shows the need to change or discontinue your antibiotic.    Follow up with your primary care provider on Monday.  Go to the emergency department if you have worsening symptoms.        ED Prescriptions     Medication Sig Dispense Auth. Provider   cephALEXin  (KEFLEX ) 500 MG capsule Take 1 capsule (500 mg total) by mouth 2 (two) times daily for 5 days. 10 capsule Corlis Burnard DEL, NP      PDMP not reviewed this encounter.   Corlis Burnard DEL, NP 09/05/24 [phone]

## 2024-09-05 NOTE — Discharge Instructions (Addendum)
 Take the antibiotic as directed.  The urine culture is pending.  We will call you if it shows the need to change or discontinue your antibiotic.    Follow up with your primary care provider on Monday.  Go to the emergency department if you have worsening symptoms.

## 2024-09-05 NOTE — ED Triage Notes (Signed)
 Patient to Urgent Care with complaints of lower back pain/ urinary frequency/ dark urine. Symptoms started Thursday.   Completed a home urine test w/ positive results for a UTI.   Concerned because she has a cardiac stress test on Monday.

## 2024-09-15 NOTE — Progress Notes (Signed)
 Chief Complaint  Patient presents with  . Urinary Frequency    Subjective  Whitney Glover is a 78 y.o. female who presents for Urinary Frequency HPI History of Present Illness Whitney Glover is a 78 year old female with recurrent urinary tract infections who presents with concerns of a possible UTI.  She has a history of recurrent urinary tract infections and has been treated with multiple prescriptions, including Cefalexin, since April. She completed a course of Cefalexin, consisting of ten pills, which was effective, but she still experienced some back pain. A home test for a UTI showed results on the borderline between negative and positive.  She is concerned about the recurrence of symptoms and wants to prevent the condition from worsening, as it did previously when she experienced severe back pain that impaired her ability to walk. She describes the back pain as severe enough to prevent her from standing or walking, recalling a previous episode where she was in tears due to the pain. Initially attributing the pain to her existing back problems, she now seeks to address it promptly to avoid severe symptoms.  In the review of symptoms, she denies experiencing burning during urination or increased frequency of urination. She mentions taking two fluid pills but notes difficulty in urination despite this.  Her past medical history includes COPD, which has improved from stage two to stage one over the past six years, and a history of smoking cessation. She also mentions having had two slipped discs and a fractured tailbone. She receives annual flu shots and has a history of two spots on her lungs, one of which has resolved.  She lives in a tiny house on her daughter's property and has adjusted her diet to reduce salt intake, avoiding salty foods and opting for low-salt options when available.  Review of Systems  Patient Active Problem List  Diagnosis  . Hyperlipidemia- LDL goal less than 70   . Adhesive capsulitis  . Unspecified vitamin D deficiency  . History of hepatitis  . Former smoker  . Pulmonary nodules- stable but now doing annual cancer CT screens  . Hypercalcemia  . Osteoporosis, postmenopausal  . Multiple thyroid nodules  . Fatty liver  . Osteoporosis, post-menopausal  . Advance directive discussed with patient  . Aortic atherosclerosis  . Leg edema  . Prediabetes  . Stage 3b chronic kidney disease (CMS-HCC)  . Chronic obstructive pulmonary disease (CMS/HHS-HCC)  . Back pain  . Encounter for therapeutic drug level monitoring  . Skin lesion  . Chronic constipation    Outpatient Medications Prior to Visit  Medication Sig Dispense Refill  . acetaminophen (TYLENOL) 325 MG tablet Take 325 mg by mouth every 6 (six) hours as needed Takes every morning and as needed    . albuterol  MDI, PROVENTIL , VENTOLIN , PROAIR , HFA 90 mcg/actuation inhaler Inhale 2 inhalations into the lungs every 6 (six) hours as needed 54 g 2  . aspirin 81 mg tablet Take 81 mg by mouth once daily    . atorvastatin (LIPITOR) 20 MG tablet Take 1 tablet (20 mg total) by mouth once daily 100 tablet 3  . FUROsemide (LASIX) 20 MG tablet Take 1 tablet (20 mg total) by mouth once daily 90 tablet 0  . magnesium hydroxide (PHILLIPS MILK OF MAGNESIA ORAL) Take by mouth once daily as needed    . spironolactone (ALDACTONE) 25 MG tablet Take 1 tablet by mouth once daily 100 tablet 3  . VITAMIN D3 ORAL Take 2,000 Units by mouth 50mcg/2000IU  No facility-administered medications prior to visit.      Objective  Vitals:   09/15/24 1030  BP: 132/83  Pulse: 73  SpO2: 99%  Weight: 78.5 kg (173 lb)  PainSc:   4  PainLoc: Back   Body mass index is 33.79 kg/m.  Home Vitals:     Physical Exam Physical Exam    Constitutional: alert, obese, in NAD, and communicates well Eye exam: pupils equal and reactive, extraocular eye movements intact. Neck: supple, no thyroid enlargement or cervical  adenopathy, and no bruits heard Respiratory: clear to auscultation, without rales or wheezes  Cardiovascular: regular rate and rhythm and without murmurs, rubs or gallops Lower extremities: no lower extremity edema   Results DIAGNOSTIC Pulmonary function test: FEV1 89-90% predicted  Results for orders placed or performed in visit on 09/15/24  [facility] POC Urinalysis Chemical w/Option to Reflex Urine Culture  Result Value Ref Range   See Comment     Color Light Yellow Colorless, Straw, Yellow, Light Yellow, Dark Yellow   Clarity SL Cloudy (!) Clear   Specific Gravity 1.015 1.005 - 1.030   pH, Urine 6.0 5.0 - 8.0   Protein, Urinalysis Negative Negative   Glucose, Urinalysis Negative Negative   Ketones, Urinalysis Negative Negative   Blood, Urinalysis Trace (!) Negative   Nitrite, Urinalysis Negative Negative   Leukocytes, Urinalysis 1+ (!) Negative   Bilirubin, Urinalysis Negative Negative   Urobilinogen, Urinalysis 0.2 0.2 - 1.0 mg/dL   Narrative   POC TEST(S) ABOVE PERFORMED AT THE PATIENT CARE LOCATION AND OVERSEEN BY THE [facility] POCT PROGRAM.        Assessment/Plan:   Assessment & Plan Recurrent urinary tract infections Recurrent urinary tract infection with positive urinalysis for white blood cells. Previous treatment with Cefalexin was effective. No dysuria or increased urinary frequency reported. - Prescribed Cefalexin 250 mg orally three times a day for five days.  Sample for cultures to verify sensitivities. - Advised to increase fluid intake to at least three to four bottles of water  per day to aid in flushing the urinary tract.  Back pain Chronic back pain, possibly related to arthritis or previous injuries.  Chronic obstructive pulmonary disease (COPD) COPD with significant improvement in lung function over the past six years. Improvement from stage 2 to stage 1 COPD with increased oxygen saturation levels. Diagnoses and all orders for this visit:  Acute cystitis  without hematuria -     cephalexin  (KEFLEX ) 250 MG capsule; Take 1 capsule (250 mg total) by mouth 3 (three) times daily for 5 days  UTI symptoms -     [facility] POC Urinalysis Chemical w/Option to Reflex Urine Culture -     CULTURE, URINE, REFLEXED FROM PYURIA SCREEN  Other emphysema (CMS/HHS-HCC)    This visit was coded based on medical decision making (MDM).           Future Appointments     Date/Time Provider Department Center Visit Type   10/12/2024 9:00 AM (Arrive by 8:45 AM) POPULATION HEALTH NURSE - [facility] [place] [facility] WELLNESS   10/12/2024 10:00 AM (Arrive by 9:45 AM) Jyl Heron Haff, MD [facility] OFFICE VISIT   10/26/2024 9:30 AM Florencio Cara Endow, MD [facility] C FOLLOW UP   08/09/2025 9:00 AM Parris Manna, MD [facility] C RETURN VISIT       Patient Instructions  Diagnoses and all orders for this visit:  Acute cystitis without hematuria -  cephalexin  (KEFLEX ) 250 MG capsule; Take 1 capsule (250 mg total) by mouth 3 (three) times daily for 5 days  UTI symptoms -     [facility] POC Urinalysis Chemical w/Option to Reflex Urine Culture -     CULTURE, URINE, REFLEXED FROM PYURIA SCREEN     An after visit summary was provided for the patient either in written format (printed) or through [facility].  This note has been created using automated tools and reviewed for accuracy by MARIO E OLMEDO.

## 2024-09-28 ENCOUNTER — Ambulatory Visit

## 2024-10-01 ENCOUNTER — Encounter (HOSPITAL_COMMUNITY): Payer: Self-pay

## 2024-10-02 ENCOUNTER — Telehealth (HOSPITAL_COMMUNITY): Payer: Self-pay | Admitting: *Deleted

## 2024-10-02 NOTE — Telephone Encounter (Signed)
 Reaching out to patient to offer assistance regarding upcoming cardiac imaging study; pt verbalizes understanding of appt date/time, parking situation and where to check in, pre-test NPO status, and verified current allergies; name and call back number provided for further questions should they arise  Larey Brick RN Navigator Cardiac Imaging [facility] [phone] office [phone] cell

## 2024-10-05 ENCOUNTER — Ambulatory Visit
Admission: RE | Admit: 2024-10-05 | Discharge: 2024-10-05 | Disposition: A | Discharge status: Home / Self Care | Source: Ambulatory Visit | Attending: Internal Medicine | Admitting: Internal Medicine

## 2024-10-05 DIAGNOSIS — R918 Other nonspecific abnormal finding of lung field: Secondary | ICD-10-CM | POA: Diagnosis not present

## 2024-10-05 DIAGNOSIS — I2510 Atherosclerotic heart disease of native coronary artery without angina pectoris: Secondary | ICD-10-CM | POA: Diagnosis not present

## 2024-10-05 DIAGNOSIS — K760 Fatty (change of) liver, not elsewhere classified: Secondary | ICD-10-CM | POA: Diagnosis not present

## 2024-10-05 DIAGNOSIS — R0602 Shortness of breath: Secondary | ICD-10-CM | POA: Diagnosis present

## 2024-10-05 DIAGNOSIS — R9389 Abnormal findings on diagnostic imaging of other specified body structures: Secondary | ICD-10-CM | POA: Diagnosis present

## 2024-10-05 DIAGNOSIS — I700 Atherosclerosis of aorta: Secondary | ICD-10-CM | POA: Diagnosis not present

## 2024-10-05 LAB — POCT I-STAT CREATININE: Creatinine, Ser: 1.3 mg/dL — ABNORMAL HIGH (ref 0.44–1.00)

## 2024-10-05 MED ORDER — METOPROLOL TARTRATE 5 MG/5ML IV SOLN: 10.0000 mg | INTRAVENOUS | Status: DC | PRN

## 2024-10-05 MED ORDER — IOHEXOL 350 MG/ML SOLN
100.0000 mL | Freq: Once | INTRAVENOUS | Status: AC | PRN
  Administered 2024-10-05: 100 mL via INTRAVENOUS

## 2024-10-05 MED ORDER — DILTIAZEM HCL 25 MG/5ML IV SOLN: 10.0000 mg | INTRAVENOUS | Status: DC | PRN

## 2024-10-05 MED ORDER — NITROGLYCERIN 0.4 MG SL SUBL
0.8000 mg | SUBLINGUAL_TABLET | Freq: Once | SUBLINGUAL | Status: AC
  Administered 2024-10-05: 0.8 mg via SUBLINGUAL

## 2024-10-05 NOTE — Progress Notes (Signed)
 Patient tolerated procedure well. Ambulate w/o difficulty. Denies any lightheadedness or being dizzy. Pt denies any pain at this time. Sitting in chair. Pt is encouraged to drink additional water throughout the day and reason explained to patient. Patient verbalized understanding and all questions answered. ABC intact. No further needs at this time. Discharge from procedure area w/o issues.

## 2024-10-06 ENCOUNTER — Other Ambulatory Visit: Payer: Self-pay | Admitting: Family Medicine

## 2024-10-06 DIAGNOSIS — Z1231 Encounter for screening mammogram for malignant neoplasm of breast: Secondary | ICD-10-CM

## 2024-10-12 ENCOUNTER — Other Ambulatory Visit: Payer: Self-pay | Admitting: Family Medicine

## 2024-10-12 DIAGNOSIS — E7800 Pure hypercholesterolemia, unspecified: Secondary | ICD-10-CM

## 2024-10-12 DIAGNOSIS — M8580 Other specified disorders of bone density and structure, unspecified site: Secondary | ICD-10-CM

## 2024-10-12 DIAGNOSIS — Z780 Asymptomatic menopausal state: Secondary | ICD-10-CM

## 2024-10-12 DIAGNOSIS — N1831 Chronic kidney disease, stage 3a: Secondary | ICD-10-CM

## 2024-10-22 ENCOUNTER — Ambulatory Visit
Admission: RE | Admit: 2024-10-22 | Discharge: 2024-10-22 | Disposition: A | Source: Ambulatory Visit | Attending: Family Medicine | Admitting: Family Medicine

## 2024-10-22 DIAGNOSIS — N1831 Chronic kidney disease, stage 3a: Secondary | ICD-10-CM | POA: Diagnosis present

## 2024-10-22 DIAGNOSIS — E7800 Pure hypercholesterolemia, unspecified: Secondary | ICD-10-CM | POA: Diagnosis present

## 2024-10-29 ENCOUNTER — Other Ambulatory Visit: Payer: Self-pay | Admitting: Family Medicine

## 2024-10-29 DIAGNOSIS — N2889 Other specified disorders of kidney and ureter: Secondary | ICD-10-CM

## 2024-11-04 ENCOUNTER — Other Ambulatory Visit: Payer: Self-pay | Admitting: Radiology

## 2024-11-04 ENCOUNTER — Ambulatory Visit: Admission: RE | Admit: 2024-11-04 | Discharge: 2024-11-04 | Attending: Family Medicine | Admitting: Family Medicine

## 2024-11-04 DIAGNOSIS — N2889 Other specified disorders of kidney and ureter: Secondary | ICD-10-CM | POA: Diagnosis present

## 2024-11-04 MED ORDER — GADOBUTROL 1 MMOL/ML IV SOLN
7.5000 mL | Freq: Once | INTRAVENOUS | Status: AC | PRN
  Administered 2024-11-04: 13:00:00 7.5 mL via INTRAVENOUS

## 2024-11-25 ENCOUNTER — Ambulatory Visit
Admission: RE | Admit: 2024-11-25 | Discharge: 2024-11-25 | Disposition: A | Discharge status: Home / Self Care | Source: Ambulatory Visit | Attending: Family Medicine | Admitting: Family Medicine

## 2024-11-25 DIAGNOSIS — Z1231 Encounter for screening mammogram for malignant neoplasm of breast: Secondary | ICD-10-CM | POA: Diagnosis present
